# Patient Record
Sex: Male | Born: 1950 | Race: White | Hispanic: No | Marital: Married | Smoking: Former smoker
Health system: Southern US, Community
[De-identification: ages and names within clinical notes are randomized; demographics above are authoritative.]

## PROBLEM LIST (undated history)

## (undated) DIAGNOSIS — E785 Hyperlipidemia, unspecified: Secondary | ICD-10-CM

## (undated) HISTORY — DX: Hyperlipidemia, unspecified: E78.5

---
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
## 2003-07-10 ENCOUNTER — Ambulatory Visit (HOSPITAL_COMMUNITY): Admission: RE | Admit: 2003-07-10 | Discharge: 2003-07-10 | Payer: Self-pay | Admitting: Unknown Physician Specialty

## 2003-07-11 ENCOUNTER — Ambulatory Visit (HOSPITAL_COMMUNITY): Admission: RE | Admit: 2003-07-11 | Discharge: 2003-07-11 | Payer: Self-pay | Admitting: Unknown Physician Specialty

## 2003-07-14 ENCOUNTER — Ambulatory Visit (HOSPITAL_COMMUNITY): Admission: RE | Admit: 2003-07-14 | Discharge: 2003-07-14 | Payer: Self-pay | Admitting: Unknown Physician Specialty

## 2004-09-03 ENCOUNTER — Ambulatory Visit (HOSPITAL_COMMUNITY): Admission: RE | Admit: 2004-09-03 | Discharge: 2004-09-03 | Payer: Self-pay | Admitting: Gastroenterology

## 2004-09-03 ENCOUNTER — Encounter (INDEPENDENT_AMBULATORY_CARE_PROVIDER_SITE_OTHER): Payer: Self-pay | Admitting: *Deleted

## 2007-11-12 ENCOUNTER — Encounter: Payer: Self-pay | Admitting: Family Medicine

## 2010-02-12 LAB — HM COLONOSCOPY

## 2010-02-15 ENCOUNTER — Encounter: Payer: Self-pay | Admitting: Family Medicine

## 2010-02-25 ENCOUNTER — Encounter: Payer: Self-pay | Admitting: Family Medicine

## 2010-02-26 ENCOUNTER — Ambulatory Visit: Payer: Self-pay | Admitting: Family Medicine

## 2010-02-26 DIAGNOSIS — E785 Hyperlipidemia, unspecified: Secondary | ICD-10-CM | POA: Insufficient documentation

## 2010-02-26 HISTORY — DX: Hyperlipidemia, unspecified: E78.5

## 2010-05-25 NOTE — Procedures (Signed)
Summary: Colonoscopy Report/[facility]  Colonoscopy Report/[facility]   Imported By: Maryln Gottron 03/09/2010 14:23:00  _____________________________________________________________________  External Attachment:    Type:   Image     Comment:   External Document

## 2010-05-25 NOTE — Letter (Signed)
Summary: Records from [facility] [place] 2006 - 2009  Records from [facility] [place] 2006 - 2009   Imported By: Maryln Gottron 03/08/2010 08:37:03  _____________________________________________________________________  External Attachment:    Type:   Image     Comment:   External Document

## 2010-05-25 NOTE — Assessment & Plan Note (Signed)
Summary: NEW PT TRANSFER FROM [facility]//SLM/pt req cpx/cjr   Vital Signs:  Patient profile:   60 year old male Height:      74.50 inches Weight:      226 pounds BMI:     28.73 Temp:     99.3 degrees F oral Pulse rate:   88 / minute Pulse rhythm:   regular Resp:     12 per minute BP sitting:   140 / 82  (left arm) Cuff size:   large  Vitals Entered By: Sid Falcon LPN (February 26, 2010 2:57 PM)  Nutrition Counseling: Patient's BMI is greater than 25 and therefore counseled on weight management options.   History of Present Illness: Patient here for complete physical examination and to establish care. History of hyperlipidemia and migraine headaches. Colon polyps which were adenomatous. Recent colonoscopy just a couple weeks ago.  Couple of polyps removed with path pending.  PMH, FH, AND SH reviewed.  Preventive Screening-Counseling & Management  Alcohol-Tobacco     Smoking Status: current     Packs/Day: 0.5     Year Started: 2006  Caffeine-Diet-Exercise     Does Patient Exercise: no  Allergies (verified): No Known Drug Allergies  Past History:  Family History: Last updated: 02/26/2010 Family History of Alcoholism/Addiction, Father  heart disease, hypertension  Mother  Social History: Last updated: 02/26/2010 Occupation:  MD Married Current Smoker Alcohol use-yes Regular exercise-no  Risk Factors: Exercise: no (02/26/2010)  Risk Factors: Smoking Status: current (02/26/2010) Packs/Day: 0.5 (02/26/2010)  Past Medical History: Migraines Colon Polyps Hyperlipidemia  Past Surgical History: Ganglion cyst Left [phone] PMH-FH-SH reviewed for relevance  Family History: Family History of Alcoholism/Addiction, Father  heart disease, hypertension  Mother  Social History: Occupation:  MD Married Current Smoker Alcohol use-yes Regular exercise-no Packs/Day:  0.5 Smoking Status:  current Occupation:  employed Does Patient Exercise:   no  Review of Systems  The patient denies anorexia, fever, weight gain, vision loss, decreased hearing, hoarseness, chest pain, syncope, dyspnea on exertion, peripheral edema, prolonged cough, headaches, hemoptysis, abdominal pain, melena, hematochezia, severe indigestion/heartburn, hematuria, incontinence, genital sores, muscle weakness, suspicious skin lesions, transient blindness, difficulty walking, depression, unusual weight change, abnormal bleeding, enlarged lymph nodes, and testicular masses.         some recent weight loss by his efforts.  Physical Exam  General:  Well-developed,well-nourished,in no acute distress; alert,appropriate and cooperative throughout examination Head:  Normocephalic and atraumatic without obvious abnormalities. No apparent alopecia or balding. Eyes:  No corneal or conjunctival inflammation noted. EOMI. Perrla. Funduscopic exam benign, without hemorrhages, exudates or papilledema. Vision grossly normal. Ears:  External ear exam shows no significant lesions or deformities.  Otoscopic examination reveals clear canals, tympanic membranes are intact bilaterally without bulging, retraction, inflammation or discharge. Hearing is grossly normal bilaterally. Mouth:  Oral mucosa and oropharynx without lesions or exudates.  Teeth in good repair. Neck:  No deformities, masses, or tenderness noted. Lungs:  Normal respiratory effort, chest expands symmetrically. Lungs are clear to auscultation, no crackles or wheezes. Heart:  Normal rate and regular rhythm. S1 and S2 normal without gallop, murmur, click, rub or other extra sounds. Abdomen:  Bowel sounds positive,abdomen soft and non-tender without masses, organomegaly or hernias noted. Rectal:  colonoscopoy 2 weeks ago. Prostate:  per GI 2 weeks ago. Msk:  No deformity or scoliosis noted of thoracic or lumbar spine.   Extremities:  No clubbing, cyanosis, edema, or deformity noted with normal full range of motion of all  joints.  Neurologic:  alert & oriented X3 and cranial nerves II-XII intact.   Skin:  no rashes and no suspicious lesions.   Cervical Nodes:  No lymphadenopathy noted Psych:  Cognition and judgment appear intact. Alert and cooperative with normal attention span and concentration. No apparent delusions, illusions, hallucinations   Impression & Recommendations:  Problem # 1:  Preventive Health Care (ICD-V70.0) discussed smoking cessation.  Cont with weight loss efforts.  Labs reviewed with pt.  BP sl high today and pt will monitor.  Already had flu vaccine.  Complete Medication List: 1)  Crestor 10 Mg Tabs (Rosuvastatin calcium) .... One tab by mouth m-w-f   Orders Added: 1)  New Patient 40-64 years [99386]    Preventive Care Screening  Colonoscopy:    Date:  01/23/2010    Results:  normal   Last Tetanus Booster:    Date:  04/26/2003    Results:  Historical     Preventive Care Screening  Colonoscopy:    Date:  01/23/2010    Results:  normal   Last Tetanus Booster:    Date:  04/26/2003    Results:  Historical

## 2010-09-10 NOTE — Op Note (Signed)
NAMECALYN, Luis Ross              ACCOUNT NO.:  [id number]   MEDICAL RECORD NO.:  [id number]          PATIENT TYPE:  AMB   LOCATION:  ENDO                         FACILITY:  [facility]   PHYSICIAN:  Bernette Redbird, M.D.   DATE OF BIRTH:  07-24-1950   DATE OF PROCEDURE:  09/03/2004  DATE OF DISCHARGE:                                 OPERATIVE REPORT   PROCEDURE:  Colonoscopy with biopsy.   ENDOSCOPIST:  Bernette Redbird, M.D.   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   FINDINGS:  Diminutive polyp at 30 cm, otherwise normal to the terminal  ileum.   PROCEDURE:  The nature, purpose and risks of the procedure had been reviewed  with the patient, who provided written consent.  Sedation was fentanyl 25  mcg and Versed 2.5 mg IV, without arrhythmias or desaturation..  Digital  exam of the prostate was entirely normal.  The Olympus standard adult video  colonoscope was advanced to the terminal ileum without any difficulty and  pullback was then performed.  The quality of prep was excellent and it was  felt that all areas were well-seen.   There was a 2- to 3-mm sessile polyp removed by cold biopsy during insertion  of the scope.  On pullout, it was measured at about 30 cm from the external  anal opening.  No other polyps were seen and there was evidence of cancer,  colitis, vascular malformations or diverticulosis.  Retroflexion in the  rectum and re-inspection of the rectum were normal.   The patient tolerated the procedure well and there were no apparent  complications.   IMPRESSION:  Solitary diminutive polyp in the sigmoid colon (211.3).   PLAN:  Await pathology results.  Anticipate colonoscopic followup in 5  years, in view of the family history of colon polyp.      RB/MEDQ  D:  09/03/2004  T:  09/04/2004  Job:  [id number]

## 2010-09-10 NOTE — Procedures (Signed)
NAMEZAYLYN, Luis Ross                        ACCOUNT NO.:  [id number]   MEDICAL RECORD NO.:  [id number]                   PATIENT TYPE:  OUT   LOCATION:  RAD                                  FACILITY:  [facility]   PHYSICIAN:  Vida Roller, M.D.                DATE OF BIRTH:  [birth year]   DATE OF PROCEDURE:  07/14/2003  DATE OF DISCHARGE:                                  ECHOCARDIOGRAM   TAPE NUMBER:  [id number], tape count [id number].   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  No previous studies.   TECHNICAL QUALITY:  Poor.   M-MODE TRACINGS:  1. The aorta is 32 mm.  2. Left atrium is 43 mm.  3. The septum is 15 mm.  4. The posterior wall is 15 mm.  5. The left ventricular diastolic dimension is 46 mm.  6. The left ventricular systolic dimension is 33 mm.   2-D AND DOPPLER IMAGING:  1. The left ventricle is normal size with mild concentric left ventricular     hypertrophy.  There are no obvious wall motion abnormalities.  Overall     ejection fraction is 55 to 60%.  Diastolic function appears to be mildly     impaired by transmitral Doppler and pulmonary venous tracings.  2. The right ventricle is normal size with normal systolic function.  3. Both atria appear to be top normal size with the left atrium being     borderline enlarged.  There is no obvious atrial septal defect.  4. The aortic valve appears to open normally.  It is mildly sclerotic.     There is no stenosis of regurgitation seen.  5. The mitral valve is morphologically unremarkable with no evidence of     stenosis or regurgitation.  6. The tricuspid valve has mild regurgitation.  7. Pulmonic valve was not well seen.  8. The inferior vena cava appears to be normal size.  9. The pericardial structures are normal.  10.      The ascending aorta was not well seen.      ___________________________________________  Vida Roller, M.D.   JH/MEDQ  D:  07/14/2003  T:  07/15/2003  Job:  [id number]

## 2011-04-15 ENCOUNTER — Ambulatory Visit (INDEPENDENT_AMBULATORY_CARE_PROVIDER_SITE_OTHER): Payer: 59 | Admitting: Family Medicine

## 2011-04-15 ENCOUNTER — Encounter: Payer: Self-pay | Admitting: Family Medicine

## 2011-04-15 DIAGNOSIS — F17200 Nicotine dependence, unspecified, uncomplicated: Secondary | ICD-10-CM

## 2011-04-15 DIAGNOSIS — Z0000 Encounter for general adult medical examination without abnormal findings: Secondary | ICD-10-CM

## 2011-04-15 NOTE — Progress Notes (Signed)
  Subjective:    Patient ID: Luis Ross, male    DOB: [birth year], 60 y.o.   MRN: [id number]  HPI  Patient for complete physical. History of hyperlipidemia but this has been mild. He had myalgias with multiple statins and took himself off recently. He is still smoking about half pack cigarettes per day. Tried quitting several times in the past. Intolerance to Chantix and Wellbutrin. 2005. Flu vaccine already received.  Family history, social history, and past medical history reviewed as below  Past Medical History  Diagnosis Date  . HYPERLIPIDEMIA 02/26/2010   Past Surgical History  Procedure Date  . Ganglion cyst excision 1964    left wrist    reports that he has been smoking Cigarettes.  He has a 5 pack-year smoking history. He does not have any smokeless tobacco history on file. His alcohol and drug histories not on file. family history includes Alcohol abuse in his father; Heart disease in his mother; and Hypertension in his mother. Allergies  Allergen Reactions  . Crestor (Rosuvastatin Calcium)     myalgias      Review of Systems  Constitutional: Negative for fever, activity change, appetite change and fatigue.  HENT: Negative for ear pain, congestion and trouble swallowing.   Eyes: Negative for pain and visual disturbance.  Respiratory: Negative for cough, shortness of breath and wheezing.   Cardiovascular: Negative for chest pain and palpitations.  Gastrointestinal: Negative for nausea, vomiting, abdominal pain, diarrhea, constipation, blood in stool, abdominal distention and rectal pain.  Genitourinary: Negative for dysuria, hematuria and testicular pain.  Musculoskeletal: Negative for joint swelling and arthralgias.  Skin: Negative for rash.  Neurological: Negative for dizziness, syncope and headaches.  Hematological: Negative for adenopathy.  Psychiatric/Behavioral: Negative for confusion and dysphoric mood.       Objective:   Physical Exam  Constitutional:  He is oriented to person, place, and time. He appears well-developed and well-nourished. No distress.  HENT:  Head: Normocephalic and atraumatic.  Right Ear: External ear normal.  Left Ear: External ear normal.  Mouth/Throat: Oropharynx is clear and moist.  Eyes: Conjunctivae and EOM are normal. Pupils are equal, round, and reactive to light.  Neck: Normal range of motion. Neck supple. No thyromegaly present.  Cardiovascular: Normal rate, regular rhythm and normal heart sounds.   No murmur heard. Pulmonary/Chest: No respiratory distress. He has no wheezes. He has no rales.  Abdominal: Soft. Bowel sounds are normal. He exhibits no distension and no mass. There is no tenderness. There is no rebound and no guarding.  Genitourinary: Rectum normal and prostate normal.  Musculoskeletal: He exhibits no edema.  Lymphadenopathy:    He has no cervical adenopathy.  Neurological: He is alert and oriented to person, place, and time. He displays normal reflexes. No cranial nerve deficit.  Skin: No rash noted.  Psychiatric: He has a normal mood and affect.          Assessment & Plan:  Complete physical. Patient strongly encouraged to stop smoking. 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 Labs reviewed. Prediabetes with fasting blood sugar 100. Continue exercise weight loss efforts. Colonoscopy up-to-date.

## 2011-04-16 DIAGNOSIS — F17200 Nicotine dependence, unspecified, uncomplicated: Secondary | ICD-10-CM | POA: Insufficient documentation

## 2013-07-05 ENCOUNTER — Encounter: Payer: Self-pay | Admitting: Family Medicine

## 2013-07-05 ENCOUNTER — Ambulatory Visit (INDEPENDENT_AMBULATORY_CARE_PROVIDER_SITE_OTHER): Payer: 59 | Admitting: Family Medicine

## 2013-07-05 VITALS — BP 140/80 | HR 98 | Temp 98.6°F | Ht 74.5 in | Wt 226.0 lb

## 2013-07-05 DIAGNOSIS — E119 Type 2 diabetes mellitus without complications: Secondary | ICD-10-CM

## 2013-07-05 DIAGNOSIS — Z0000 Encounter for general adult medical examination without abnormal findings: Secondary | ICD-10-CM

## 2013-07-05 NOTE — Progress Notes (Signed)
Pre visit review using our clinic review tool, if applicable. No additional management support is needed unless otherwise documented below in the visit note. 

## 2013-07-05 NOTE — Progress Notes (Signed)
Subjective:    Patient ID: Luis Ross, male    DOB: June 18, 1950, 63 y.o.   MRN: [id number]  HPI Patient seen for complete physical. He has history of dyslipidemia has been intolerant of higher dose statins and only takes very low dose Crestor- 5mg  once weekly. 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 These were significant for glucose of 194. He's had no history of diabetes and no recent symptoms of polyuria or polydipsia. Hemoglobin A1c 7.3%. He does not have any family history of type 2 diabetes. He quit smoking a year and half ago and has gained about 20 pounds since then. No consistent exercise.  Colonoscopy 2011. Repeat due next year. Last tetanus 3 months ago. No shingles vaccine yet.  Past Medical History  Diagnosis Date  . HYPERLIPIDEMIA 02/26/2010   Past Surgical History  Procedure Laterality Date  . Ganglion cyst excision  1964    left wrist    reports that he has been smoking Cigarettes.  He has a 5 pack-year smoking history. He does not have any smokeless tobacco history on file. His alcohol and drug histories are not on file. family history includes Alcohol abuse in his father; Heart disease in his mother; Hypertension in his mother. Allergies  Allergen Reactions  . Crestor [Rosuvastatin Calcium]     myalgias      Review of Systems  Constitutional: Positive for fatigue. Negative for fever, activity change and appetite change.  HENT: Negative for congestion, ear pain and trouble swallowing.   Eyes: Negative for pain and visual disturbance.  Respiratory: Negative for cough, shortness of breath and wheezing.   Cardiovascular: Negative for chest pain and palpitations.  Gastrointestinal: Negative for nausea, vomiting, abdominal pain, diarrhea, constipation, blood in stool, abdominal distention and rectal pain.  Endocrine: Negative for polydipsia and polyuria.  Genitourinary: Negative for dysuria, hematuria and testicular pain.  Musculoskeletal:  Negative for arthralgias and joint swelling.  Skin: Negative for rash.  Neurological: Negative for dizziness, syncope and headaches.  Hematological: Negative for adenopathy.  Psychiatric/Behavioral: Negative for confusion and dysphoric mood.       Objective:   Physical Exam  Constitutional: He is oriented to person, place, and time. He appears well-developed and well-nourished. No distress.  HENT:  Head: Normocephalic and atraumatic.  Right Ear: External ear normal.  Left Ear: External ear normal.  Mouth/Throat: Oropharynx is clear and moist.  Eyes: Conjunctivae and EOM are normal. Pupils are equal, round, and reactive to light.  Neck: Normal range of motion. Neck supple. No thyromegaly present.  Cardiovascular: Normal rate, regular rhythm and normal heart sounds.   No murmur heard. Pulmonary/Chest: No respiratory distress. He has no wheezes. He has no rales.  Abdominal: Soft. Bowel sounds are normal. He exhibits no distension and no mass. There is no tenderness. There is no rebound and no guarding.  Genitourinary: Rectum normal and prostate normal.  Musculoskeletal: He exhibits no edema.  Lymphadenopathy:    He has no cervical adenopathy.  Neurological: He is alert and oriented to person, place, and time. He displays normal reflexes. No cranial nerve deficit.  Skin: No rash noted.  Psychiatric: He has a normal mood and affect.          Assessment & Plan:  Complete physical. Labs reviewed. Recent fasting blood sugar 194 with A1c 7.3%. He'll continue close monitoring. Start metformin.  He will start Glumetza 500 mg once daily.  Work on weight loss. Reassess A1c 3 months. Patient  will get shingles vaccine through his work. Tetanus up-to-date. Repeat colonoscopy next year.

## 2013-07-06 DIAGNOSIS — E119 Type 2 diabetes mellitus without complications: Secondary | ICD-10-CM | POA: Insufficient documentation

## 2013-07-19 ENCOUNTER — Encounter: Payer: 59 | Admitting: Family Medicine

## 2013-10-11 ENCOUNTER — Encounter: Payer: Self-pay | Admitting: Family Medicine

## 2013-10-11 ENCOUNTER — Ambulatory Visit (INDEPENDENT_AMBULATORY_CARE_PROVIDER_SITE_OTHER): Payer: Managed Care, Other (non HMO) | Admitting: Family Medicine

## 2013-10-11 VITALS — BP 130/60 | HR 85 | Temp 98.6°F | Wt 206.0 lb

## 2013-10-11 DIAGNOSIS — E119 Type 2 diabetes mellitus without complications: Secondary | ICD-10-CM

## 2013-10-11 MED ORDER — LISINOPRIL 5 MG PO TABS: 5.0000 mg | ORAL_TABLET | Freq: Every day | ORAL | Status: DC

## 2013-10-11 NOTE — Progress Notes (Signed)
   Subjective:    Patient ID: Luis Ross, male    DOB: [birth year], 63 y.o.   MRN: [id number]  HPI Followup type 2 diabetes. He was started on metformin and even at low dosage of 500 mg once daily and extended metformin he had diarrhea symptoms. Metformin was stopped and he started Invokana  Initially 100 mg a day and then subsequently 300 mg daily. He is tolerated well with exception of mild urine frequency. He has lost 20 pounds. He is making dietary changes. He's walking some for exercise. Recent A1c 6.2% which is improved from 7.3%. Urine microalbumin screen normal. Overall he feels much better. More energy.  Hyperlipidemia treated with low-dose Crestor. He had myalgias with many other statins. He is currently on taking 5 mg of Crestor once weekly and unable to tolerate more frequent dosing.  Past Medical History  Diagnosis Date  . HYPERLIPIDEMIA 02/26/2010   Past Surgical History  Procedure Laterality Date  . Ganglion cyst excision  1964    left wrist    reports that he has been smoking Cigarettes.  He has a 5 pack-year smoking history. He does not have any smokeless tobacco history on file. His alcohol and drug histories are not on file. family history includes Alcohol abuse in his father; Heart disease in his mother; Hypertension in his mother. Allergies  Allergen Reactions  . Crestor [Rosuvastatin Calcium]     myalgias      Review of Systems  Constitutional: Negative for fatigue and unexpected weight change.  Eyes: Negative for visual disturbance.  Respiratory: Negative for cough, chest tightness and shortness of breath.   Cardiovascular: Negative for chest pain, palpitations and leg swelling.  Neurological: Negative for dizziness, syncope, weakness, light-headedness and headaches.          Physical Exam  Constitutional: He appears well-developed and well-nourished.  Cardiovascular: Normal rate and regular rhythm.   Pulmonary/Chest: Effort normal and breath sounds  normal. No respiratory distress. He has no wheezes. He has no rales.  Skin:  Feet reveal no skin lesions. Good distal foot pulses. Good capillary refill. No calluses. Normal sensation with monofilament testing           Assessment & Plan:  #1 type 2 diabetes. Significantly improved with weight loss efforts. Continue Invokana.  Consider repeat A1c in 6 months. We discussed starting low-dose ACE inhibitor with lisinopril 5 mg daily #2 dyslipidemia. He'll try Crestor 5 mg one half tablet twice weekly. He is been unable to tolerate more frequent dosing

## 2013-10-11 NOTE — Progress Notes (Signed)
Pre-visit discussion using our clinic review tool. No additional management support is needed unless otherwise documented below in the visit note.  

## 2013-10-12 ENCOUNTER — Telehealth: Payer: Self-pay | Admitting: Family Medicine

## 2013-10-12 NOTE — Telephone Encounter (Signed)
Relevant patient education mailed to patient.  

## 2013-11-04 ENCOUNTER — Other Ambulatory Visit: Payer: Self-pay | Admitting: *Deleted

## 2013-11-04 MED ORDER — LISINOPRIL 5 MG PO TABS: 5.0000 mg | ORAL_TABLET | Freq: Every day | ORAL | Status: DC

## 2013-11-04 NOTE — Telephone Encounter (Signed)
Patient asked to resend Rx for lisinopril because pharmacy never received it.  Rx sent.

## 2013-11-11 ENCOUNTER — Telehealth: Payer: Self-pay | Admitting: Family Medicine

## 2013-11-11 MED ORDER — LISINOPRIL 5 MG PO TABS: 5.0000 mg | ORAL_TABLET | Freq: Every day | ORAL | Status: DC

## 2013-11-11 NOTE — Telephone Encounter (Signed)
[facility] - [place], [place] - [phone] is requesting a 90 day re-fill on lisinopril (PRINIVIL,ZESTRIL) 5 MG tablet

## 2013-11-11 NOTE — Telephone Encounter (Signed)
Rx sent to mail order

## 2013-11-13 ENCOUNTER — Telehealth: Payer: Self-pay | Admitting: Family Medicine

## 2013-11-13 MED ORDER — LISINOPRIL 5 MG PO TABS: 5.0000 mg | ORAL_TABLET | Freq: Every day | ORAL | Status: DC

## 2013-11-13 NOTE — Telephone Encounter (Signed)
[facility] - [place], [place] - [phone] is requesting re-fill on lisinopril (PRINIVIL,ZESTRIL) 5 MG tablet

## 2013-11-13 NOTE — Telephone Encounter (Signed)
RX sent to mail order 

## 2014-01-24 ENCOUNTER — Ambulatory Visit (INDEPENDENT_AMBULATORY_CARE_PROVIDER_SITE_OTHER): Payer: Managed Care, Other (non HMO) | Admitting: Family Medicine

## 2014-01-24 ENCOUNTER — Encounter: Payer: Self-pay | Admitting: Family Medicine

## 2014-01-24 VITALS — BP 130/70 | HR 91 | Wt 196.0 lb

## 2014-01-24 DIAGNOSIS — E785 Hyperlipidemia, unspecified: Secondary | ICD-10-CM

## 2014-01-24 DIAGNOSIS — E119 Type 2 diabetes mellitus without complications: Secondary | ICD-10-CM

## 2014-01-24 MED ORDER — PRAVASTATIN SODIUM 20 MG PO TABS: 20.0000 mg | ORAL_TABLET | Freq: Every day | ORAL | Status: DC

## 2014-01-24 NOTE — Progress Notes (Signed)
Pre visit review using our clinic review tool, if applicable. No additional management support is needed unless otherwise documented below in the visit note. 

## 2014-01-24 NOTE — Patient Instructions (Signed)
Repeat lipid and hepatic in 6-8 weeks.

## 2014-01-24 NOTE — Progress Notes (Signed)
   Subjective:    Patient ID: Luis Ross, male    DOB: July 26, 1950, 63 y.o.   MRN: [id number]  Diabetes Pertinent negatives for hypoglycemia include no dizziness or headaches. Pertinent negatives for diabetes include no chest pain, no fatigue and no weakness.   Patient seen for medical followup. Type 2 diabetes diagnosed last year. Initial A1c 7.3%. He has done tremendous job with weight loss and recent A1c 6.0%. He had diarrhea with metformin and is currently taking Invokana 300 mg once daily-without side effects. He had myalgias with Crestor. He never started lisinopril. He is walking some for exercise. Overall feels much better.  Past Medical History  Diagnosis Date  . HYPERLIPIDEMIA 02/26/2010   Past Surgical History  Procedure Laterality Date  . Ganglion cyst excision  1964    left wrist    reports that he has been smoking Cigarettes.  He has a 5 pack-year smoking history. He does not have any smokeless tobacco history on file. His alcohol and drug histories are not on file. family history includes Alcohol abuse in his father; Heart disease in his mother; Hypertension in his mother. Allergies  Allergen Reactions  . Crestor [Rosuvastatin Calcium]     myalgias      Review of Systems  Constitutional: Negative for fatigue.  Eyes: Negative for visual disturbance.  Respiratory: Negative for cough, chest tightness and shortness of breath.   Cardiovascular: Negative for chest pain, palpitations and leg swelling.  Neurological: Negative for dizziness, syncope, weakness, light-headedness and headaches.       Objective:   Physical Exam  Constitutional: He is oriented to person, place, and time. He appears well-developed and well-nourished.  HENT:  Right Ear: External ear normal.  Left Ear: External ear normal.  Mouth/Throat: Oropharynx is clear and moist.  Eyes: Pupils are equal, round, and reactive to light.  Neck: Neck supple. No thyromegaly present.  Cardiovascular:  Normal rate and regular rhythm.   Pulmonary/Chest: Effort normal and breath sounds normal. No respiratory distress. He has no wheezes. He has no rales.  Musculoskeletal: He exhibits no edema.  Neurological: He is alert and oriented to person, place, and time.          Assessment & Plan:  Type 2 diabetes. Improved. Continue weight control efforts. Recheck A1c in 3 months.  Continue with Invokana.  Hyperlipidemia. Crestor associated myalgias. Try pravastatin 20 mg once daily. Lipid panel in 6-8 weeks.

## 2014-05-02 ENCOUNTER — Ambulatory Visit: Payer: Managed Care, Other (non HMO) | Admitting: Family Medicine

## 2014-05-23 ENCOUNTER — Ambulatory Visit (INDEPENDENT_AMBULATORY_CARE_PROVIDER_SITE_OTHER): Payer: Managed Care, Other (non HMO) | Admitting: Family Medicine

## 2014-05-23 ENCOUNTER — Encounter: Payer: Self-pay | Admitting: Family Medicine

## 2014-05-23 VITALS — BP 130/78 | HR 93 | Temp 98.6°F | Wt 194.0 lb

## 2014-05-23 DIAGNOSIS — E119 Type 2 diabetes mellitus without complications: Secondary | ICD-10-CM

## 2014-05-23 NOTE — Progress Notes (Signed)
   Subjective:    Patient ID: Luis Ross, male    DOB: [birth year], 64 y.o.   MRN: [id number]  HPI   Follow-up type 2 diabetes. He has done excellent job with weight loss and is walking couple miles most days. He had some intolerance with metformin and is currently on Invokana. Also taking pravastatin 3 times per week and low-dose lisinopril 5 mg once daily. No hypertension issues.  Generally feels well. No symptoms of polyuria or polydipsia. He brings in copy of labs today. A1c 6.1%. Lipids are well controlled. No major abnormalities  Past Medical History  Diagnosis Date  . HYPERLIPIDEMIA 02/26/2010   Past Surgical History  Procedure Laterality Date  . Ganglion cyst excision  1964    left wrist    reports that he has been smoking Cigarettes.  He has a 5 pack-year smoking history. He does not have any smokeless tobacco history on file. His alcohol and drug histories are not on file. family history includes Alcohol abuse in his father; Heart disease in his mother; Hypertension in his mother. Allergies  Allergen Reactions  . Crestor [Rosuvastatin Calcium]     myalgias      Review of Systems  Constitutional: Negative for fever, activity change, appetite change and fatigue.  HENT: Negative for congestion, ear pain and trouble swallowing.   Eyes: Negative for pain and visual disturbance.  Respiratory: Negative for cough, shortness of breath and wheezing.   Cardiovascular: Negative for chest pain and palpitations.  Gastrointestinal: Negative for nausea, vomiting, abdominal pain, diarrhea, constipation, blood in stool, abdominal distention and rectal pain.  Endocrine: Negative for polydipsia and polyuria.  Genitourinary: Negative for dysuria, hematuria and testicular pain.  Musculoskeletal: Negative for joint swelling and arthralgias.  Skin: Negative for rash.  Neurological: Negative for dizziness, syncope and headaches.  Hematological: Negative for adenopathy.    Psychiatric/Behavioral: Negative for confusion and dysphoric mood.       Objective:   Physical Exam  Constitutional: He appears well-developed and well-nourished.  Cardiovascular: Normal rate and regular rhythm.   No murmur heard. Pulmonary/Chest: Effort normal and breath sounds normal. No respiratory distress. He has no wheezes. He has no rales.  Musculoskeletal: He exhibits no edema.          Assessment & Plan:  Type 2 diabetes. Improved. Continue Invokana. Continue weight control efforts. Reassess hemoglobin A1c in 3 months

## 2014-05-23 NOTE — Progress Notes (Signed)
Pre visit review using our clinic review tool, if applicable. No additional management support is needed unless otherwise documented below in the visit note. 

## 2014-05-26 ENCOUNTER — Ambulatory Visit: Payer: Managed Care, Other (non HMO) | Admitting: Family Medicine

## 2014-08-29 ENCOUNTER — Encounter: Payer: Self-pay | Admitting: Family Medicine

## 2014-08-29 ENCOUNTER — Ambulatory Visit (INDEPENDENT_AMBULATORY_CARE_PROVIDER_SITE_OTHER): Payer: Managed Care, Other (non HMO) | Admitting: Family Medicine

## 2014-08-29 VITALS — BP 130/74 | HR 88 | Temp 98.4°F | Wt 189.0 lb

## 2014-08-29 DIAGNOSIS — E119 Type 2 diabetes mellitus without complications: Secondary | ICD-10-CM | POA: Diagnosis not present

## 2014-08-29 MED ORDER — CANAGLIFLOZIN 300 MG PO TABS: 300.0000 mg | ORAL_TABLET | Freq: Every day | ORAL | Status: DC

## 2014-08-29 NOTE — Progress Notes (Signed)
Pre visit review using our clinic review tool, if applicable. No additional management support is needed unless otherwise documented below in the visit note. 

## 2014-08-29 NOTE — Progress Notes (Signed)
   Subjective:    Patient ID: Luis Ross, male    DOB: 03/29/1951, 64 y.o.   MRN: [id number]  HPI Follow-up type 2 diabetes. Patient on Invokana 300 mg once daily. He has done excellent job with maintaining his weight loss. Repeat A1c 6.2%. He is somewhat disappointed this is not lower. He is still taking low-dose lisinopril and has occasional lightheadedness. Could not tolerate even low-dose pravastatin secondary to myalgias and cramps.  Past Medical History  Diagnosis Date  . HYPERLIPIDEMIA 02/26/2010   Past Surgical History  Procedure Laterality Date  . Ganglion cyst excision  1964    left wrist    reports that he has been smoking Cigarettes.  He has a 5 pack-year smoking history. He does not have any smokeless tobacco history on file. His alcohol and drug histories are not on file. family history includes Alcohol abuse in his father; Heart disease in his mother; Hypertension in his mother. Allergies  Allergen Reactions  . Crestor [Rosuvastatin Calcium]     myalgias      Review of Systems  Constitutional: Negative for fatigue and unexpected weight change.  Eyes: Negative for visual disturbance.  Respiratory: Negative for cough, chest tightness and shortness of breath.   Cardiovascular: Negative for chest pain, palpitations and leg swelling.  Neurological: Negative for dizziness, syncope, weakness, light-headedness and headaches.       Objective:   Physical Exam  Constitutional: He appears well-developed and well-nourished. No distress.  Cardiovascular: Normal rate and regular rhythm.   Pulmonary/Chest: Effort normal and breath sounds normal. No respiratory distress. He has no wheezes. He has no rales.          Assessment & Plan:  Type 2 diabetes. Stable and at goal. Continue weight control efforts. Reassess A1c in 3 months

## 2014-08-30 DIAGNOSIS — E119 Type 2 diabetes mellitus without complications: Secondary | ICD-10-CM | POA: Insufficient documentation

## 2014-08-30 DIAGNOSIS — E1165 Type 2 diabetes mellitus with hyperglycemia: Secondary | ICD-10-CM | POA: Insufficient documentation

## 2014-09-03 ENCOUNTER — Other Ambulatory Visit: Payer: Self-pay | Admitting: *Deleted

## 2014-09-03 MED ORDER — CANAGLIFLOZIN 300 MG PO TABS: 300.0000 mg | ORAL_TABLET | Freq: Every day | ORAL | Status: DC

## 2014-09-09 ENCOUNTER — Encounter: Payer: Self-pay | Admitting: Family Medicine

## 2014-11-14 LAB — HM DIABETES EYE EXAM

## 2014-11-19 ENCOUNTER — Encounter: Payer: Self-pay | Admitting: Family Medicine

## 2014-12-05 ENCOUNTER — Encounter: Payer: Self-pay | Admitting: Family Medicine

## 2014-12-05 ENCOUNTER — Ambulatory Visit (INDEPENDENT_AMBULATORY_CARE_PROVIDER_SITE_OTHER): Payer: Managed Care, Other (non HMO) | Admitting: Family Medicine

## 2014-12-05 VITALS — BP 128/70 | Temp 98.3°F | Wt 189.0 lb

## 2014-12-05 DIAGNOSIS — E119 Type 2 diabetes mellitus without complications: Secondary | ICD-10-CM | POA: Diagnosis not present

## 2014-12-05 DIAGNOSIS — E785 Hyperlipidemia, unspecified: Secondary | ICD-10-CM | POA: Diagnosis not present

## 2014-12-05 MED ORDER — EZETIMIBE 10 MG PO TABS: 10.0000 mg | ORAL_TABLET | Freq: Every day | ORAL | Status: DC

## 2014-12-05 MED ORDER — LISINOPRIL 5 MG PO TABS: 5.0000 mg | ORAL_TABLET | Freq: Every day | ORAL | Status: DC

## 2014-12-05 NOTE — Progress Notes (Signed)
   Subjective:    Patient ID: Luis Ross, male    DOB: October 30, 1950, 64 y.o.   MRN: [id number]  HPI Routine follow-up. Type 2 diabetes. Excellent control. Recent labs A1c 5.8%. Remains on Invokana,. He has been maintaining his weight. Inconsistent exercise. He's had difficulty with many statins in the past. Recent LDL cholesterol 107 with total cholesterol 178. HDL 57. He has not tried Zetia previously. Denies any recent chest pains.  Past Medical History  Diagnosis Date  . HYPERLIPIDEMIA 02/26/2010   Past Surgical History  Procedure Laterality Date  . Ganglion cyst excision  1964    left wrist    reports that he has been smoking Cigarettes.  He has a 5 pack-year smoking history. He does not have any smokeless tobacco history on file. His alcohol and drug histories are not on file. family history includes Alcohol abuse in his father; Heart disease in his mother; Hypertension in his mother. Allergies  Allergen Reactions  . Crestor [Rosuvastatin Calcium]     myalgias      Review of Systems  Constitutional: Negative for fatigue.  Eyes: Negative for visual disturbance.  Respiratory: Negative for cough, chest tightness and shortness of breath.   Cardiovascular: Negative for chest pain, palpitations and leg swelling.  Neurological: Negative for dizziness, syncope, weakness, light-headedness and headaches.       Objective:   Physical Exam  Constitutional: He is oriented to person, place, and time. He appears well-developed and well-nourished.  HENT:  Right Ear: External ear normal.  Left Ear: External ear normal.  Mouth/Throat: Oropharynx is clear and moist.  Eyes: Pupils are equal, round, and reactive to light.  Neck: Neck supple. No thyromegaly present.  Cardiovascular: Normal rate and regular rhythm.   Pulmonary/Chest: Effort normal and breath sounds normal. No respiratory distress. He has no wheezes. He has no rales.  Musculoskeletal: He exhibits no edema.  Neurological:  He is alert and oriented to person, place, and time.          Assessment & Plan:  #1 type 2 diabetes. Excellent control. Recent A1c 5.8%. Continue Invokana, #2 dyslipidemia. Recent HDL increased. LDL above 100. Intolerance to many statins.  Start Zetia 10 mg once daily. He will get follow-up lipids in 6-8 weeks

## 2014-12-05 NOTE — Progress Notes (Signed)
Pre visit review using our clinic review tool, if applicable. No additional management support is needed unless otherwise documented below in the visit note. 

## 2015-03-13 ENCOUNTER — Ambulatory Visit (INDEPENDENT_AMBULATORY_CARE_PROVIDER_SITE_OTHER): Payer: Managed Care, Other (non HMO) | Admitting: Family Medicine

## 2015-03-13 ENCOUNTER — Encounter: Payer: Self-pay | Admitting: Family Medicine

## 2015-03-13 VITALS — BP 120/80 | HR 95 | Temp 98.7°F | Resp 16 | Ht 74.5 in | Wt 197.0 lb

## 2015-03-13 DIAGNOSIS — E785 Hyperlipidemia, unspecified: Secondary | ICD-10-CM | POA: Diagnosis not present

## 2015-03-13 DIAGNOSIS — E119 Type 2 diabetes mellitus without complications: Secondary | ICD-10-CM

## 2015-03-13 NOTE — Progress Notes (Signed)
Pre visit review using our clinic review tool, if applicable. No additional management support is needed unless otherwise documented below in the visit note. 

## 2015-03-13 NOTE — Progress Notes (Signed)
   Subjective:    Patient ID: Luis Ross, male    DOB: April 11, 1951, 64 y.o.   MRN: [id number]  HPI Medical follow-up.   Has gained about 8 pounds since last visit. Blood sugars have remained stable. He had lab work yesterday. Hemoglobin A1c 6.0%. Cholesterol 179 with LDL 113. He had some myalgias with Zetia. He stopped this several weeks ago and would like to consider another trial with this. He has had intolerance with multiple statins in the past including very low-dose statins. His mother had coronary disease in her early 6360s.  Past Medical History  Diagnosis Date  . HYPERLIPIDEMIA 02/26/2010   Past Surgical History  Procedure Laterality Date  . Ganglion cyst excision  1964    left wrist    reports that he has been smoking Cigarettes.  He has a 5 pack-year smoking history. He does not have any smokeless tobacco history on file. His alcohol and drug histories are not on file. family history includes Alcohol abuse in his father; Heart disease in his mother; Hypertension in his mother. Allergies  Allergen Reactions  . Crestor [Rosuvastatin Calcium]     myalgias      Review of Systems  Constitutional: Negative for fatigue.  Eyes: Negative for visual disturbance.  Respiratory: Negative for cough, chest tightness and shortness of breath.   Cardiovascular: Negative for chest pain, palpitations and leg swelling.  Endocrine: Negative for polydipsia and polyuria.  Neurological: Negative for dizziness, syncope, weakness, light-headedness and headaches.       Objective:   Physical Exam  Constitutional: He is oriented to person, place, and time. He appears well-developed and well-nourished.  HENT:  Right Ear: External ear normal.  Left Ear: External ear normal.  Mouth/Throat: Oropharynx is clear and moist.  Eyes: Pupils are equal, round, and reactive to light.  Neck: Neck supple. No thyromegaly present.  Cardiovascular: Normal rate and regular rhythm.   Pulmonary/Chest: Effort  normal and breath sounds normal. No respiratory distress. He has no wheezes. He has no rales.  Musculoskeletal: He exhibits no edema.  Neurological: He is alert and oriented to person, place, and time.          Assessment & Plan:  #1Type 2 diabetes. Good control. Continue current regimen. Recheck A1c in 3 months #2 dyslipidemia. LDL slightly above goal.We recommended he try Zetia once more.

## 2015-06-19 ENCOUNTER — Encounter: Payer: Self-pay | Admitting: Family Medicine

## 2015-06-19 ENCOUNTER — Ambulatory Visit (INDEPENDENT_AMBULATORY_CARE_PROVIDER_SITE_OTHER): Payer: Managed Care, Other (non HMO) | Admitting: Family Medicine

## 2015-06-19 VITALS — BP 104/80 | HR 113 | Temp 99.1°F | Ht 74.5 in | Wt 200.4 lb

## 2015-06-19 DIAGNOSIS — E119 Type 2 diabetes mellitus without complications: Secondary | ICD-10-CM | POA: Diagnosis not present

## 2015-06-19 NOTE — Progress Notes (Signed)
Pre visit review using our clinic review tool, if applicable. No additional management support is needed unless otherwise documented below in the visit note. 

## 2015-06-19 NOTE — Progress Notes (Signed)
   Subjective:    Patient ID: Luis Ross, male    DOB: 1950-06-29, 65 y.o.   MRN: [id number]  HPI Follow-up type 2 diabetes. He has gained a couple of pounds since last visit. Not exercising consistently. Blood sugars been stable. A1c 6.1%. No symptoms of polyuria or polydipsia.  Mild dyslipidemia. Started back Zetia since last visit and has not seen any recurrence of myalgias. He has occasional foot cramps. He's not any recent chest pains. Remains on low-dose lisinopril for kidney protection. No history of hypertension. Is currently taking Invokana for his diabetes  Past Medical History  Diagnosis Date  . HYPERLIPIDEMIA 02/26/2010   Past Surgical History  Procedure Laterality Date  . Ganglion cyst excision  1964    left wrist    reports that he quit smoking about 3 years ago. His smoking use included Cigarettes. He has a 5 pack-year smoking history. He does not have any smokeless tobacco history on file. His alcohol and drug histories are not on file. family history includes Alcohol abuse in his father; Heart disease in his mother; Hypertension in his mother. Allergies  Allergen Reactions  . Crestor [Rosuvastatin Calcium]     myalgias      Review of Systems  Constitutional: Negative for fatigue.  Eyes: Negative for visual disturbance.  Respiratory: Negative for cough, chest tightness and shortness of breath.   Cardiovascular: Negative for chest pain, palpitations and leg swelling.  Endocrine: Negative for polydipsia and polyuria.  Neurological: Negative for dizziness, syncope, weakness, light-headedness and headaches.       Objective:   Physical Exam  Constitutional: He is oriented to person, place, and time. He appears well-developed and well-nourished.  HENT:  Right Ear: External ear normal.  Left Ear: External ear normal.  Mouth/Throat: Oropharynx is clear and moist.  Eyes: Pupils are equal, round, and reactive to light.  Neck: Neck supple. No thyromegaly  present.  Cardiovascular: Normal rate and regular rhythm.   Pulmonary/Chest: Effort normal and breath sounds normal. No respiratory distress. He has no wheezes. He has no rales.  Musculoskeletal: He exhibits no edema.  Neurological: He is alert and oriented to person, place, and time.          Assessment & Plan:  Type 2 diabetes. Stable with recent A1c 6.1%. Continue current medications as above. Lipids are improved with LDL 74. Continue with Zetia.  Follow-up in 3 months to reassess

## 2015-08-21 LAB — HM COLONOSCOPY

## 2015-09-08 ENCOUNTER — Encounter: Payer: Self-pay | Admitting: Family Medicine

## 2015-10-09 ENCOUNTER — Ambulatory Visit (INDEPENDENT_AMBULATORY_CARE_PROVIDER_SITE_OTHER): Payer: Managed Care, Other (non HMO) | Admitting: Family Medicine

## 2015-10-09 ENCOUNTER — Encounter: Payer: Self-pay | Admitting: Family Medicine

## 2015-10-09 VITALS — BP 110/72 | HR 100 | Temp 99.1°F | Ht 75.0 in | Wt 204.4 lb

## 2015-10-09 DIAGNOSIS — E119 Type 2 diabetes mellitus without complications: Secondary | ICD-10-CM

## 2015-10-09 MED ORDER — EZETIMIBE 10 MG PO TABS: 10.0000 mg | ORAL_TABLET | Freq: Every day | ORAL | Status: DC

## 2015-10-09 NOTE — Progress Notes (Signed)
Pre visit review using our clinic review tool, if applicable. No additional management support is needed unless otherwise documented below in the visit note. 

## 2015-10-09 NOTE — Progress Notes (Signed)
   Subjective:    Patient ID: Luis Ross, male    DOB: 09-09-1950, 65 y.o.   MRN: [id number]  HPI Follow-up type 2 diabetes and dyslipidemia. He started back Zetia.  Previously had some leg cramps on this Blood sugars have been stable. Not exercising regularly. Brings in copy of labs that were done yesterday. A1c 6.0%. LDL cholesterol 93 which is up from previous of 74 when he was taking lipid therapy. He has not been no tolerate any statins. He is getting regular eye exams. No chest pains or other complaints.  Past Medical History  Diagnosis Date  . HYPERLIPIDEMIA 02/26/2010   Past Surgical History  Procedure Laterality Date  . Ganglion cyst excision  1964    left wrist    reports that he quit smoking about 3 years ago. His smoking use included Cigarettes. He has a 5 pack-year smoking history. He does not have any smokeless tobacco history on file. His alcohol and drug histories are not on file. family history includes Alcohol abuse in his father; Heart disease in his mother; Hypertension in his mother. Allergies  Allergen Reactions  . Crestor [Rosuvastatin Calcium]     myalgias      Review of Systems  Constitutional: Negative for fatigue.  Eyes: Negative for visual disturbance.  Respiratory: Negative for cough, chest tightness and shortness of breath.   Cardiovascular: Negative for chest pain, palpitations and leg swelling.  Neurological: Negative for dizziness, syncope, weakness, light-headedness and headaches.       Objective:   Physical Exam  Constitutional: He is oriented to person, place, and time. He appears well-developed and well-nourished.  HENT:  Right Ear: External ear normal.  Left Ear: External ear normal.  Mouth/Throat: Oropharynx is clear and moist.  Eyes: Pupils are equal, round, and reactive to light.  Neck: Neck supple. No thyromegaly present.  Cardiovascular: Normal rate and regular rhythm.   Pulmonary/Chest: Effort normal and breath sounds  normal. No respiratory distress. He has no wheezes. He has no rales.  Musculoskeletal: He exhibits no edema.  Neurological: He is alert and oriented to person, place, and time.          Assessment & Plan:  Type 2 diabetes. Well controlled. A1c 6.0%. Continue Invokana.  Get back to more consistent exercise.  Dyslipidemia. Refill Zetia for one year.  Kristian CoveyBruce W Mei Suits MD [facility]

## 2016-01-04 ENCOUNTER — Telehealth: Payer: Self-pay | Admitting: Family Medicine

## 2016-01-04 DIAGNOSIS — E119 Type 2 diabetes mellitus without complications: Secondary | ICD-10-CM

## 2016-01-04 NOTE — Telephone Encounter (Signed)
Pt states he no longer will be getting labs at his office, and always gets prior to his appointmnet with Dr Caryl NeverBurchette. Pt is requesting a lipid panel, cmp, and a1c. Ok to schedule? Pt has appointment on 9/22 with dr burchette    ALSO, Pt needs refill of lisinopril (PRINIVIL,ZESTRIL) 5 MG tablet doxycycline (VIBRAMYCIN) 100 MG capsule  90 day [facility] [place], [place]

## 2016-01-05 MED ORDER — LISINOPRIL 5 MG PO TABS: 5.0000 mg | ORAL_TABLET | Freq: Every day | ORAL | 2 refills | Status: DC

## 2016-01-05 MED ORDER — DOXYCYCLINE HYCLATE 100 MG PO CAPS: 100.0000 mg | ORAL_CAPSULE | Freq: Every day | ORAL | 2 refills | Status: DC

## 2016-01-05 NOTE — Telephone Encounter (Signed)
Pending appt is a 3 month follow up. His lab appt is scheduled for 01/07/2016. Okay to order?

## 2016-01-05 NOTE — Telephone Encounter (Signed)
OK to order.  Go ahead and refill requested meds for one year.

## 2016-01-05 NOTE — Telephone Encounter (Signed)
Orders are entered and medications refilled.

## 2016-01-07 ENCOUNTER — Encounter: Payer: Self-pay | Admitting: Family Medicine

## 2016-01-07 ENCOUNTER — Other Ambulatory Visit (INDEPENDENT_AMBULATORY_CARE_PROVIDER_SITE_OTHER): Payer: Managed Care, Other (non HMO)

## 2016-01-07 DIAGNOSIS — E119 Type 2 diabetes mellitus without complications: Secondary | ICD-10-CM

## 2016-01-07 LAB — COMPREHENSIVE METABOLIC PANEL WITH GFR
ALT: 19 U/L (ref 0–53)
AST: 16 U/L (ref 0–37)
Albumin: 4.5 g/dL (ref 3.5–5.2)
Alkaline Phosphatase: 46 U/L (ref 39–117)
BUN: 18 mg/dL (ref 6–23)
CO2: 27 meq/L (ref 19–32)
Calcium: 9.3 mg/dL (ref 8.4–10.5)
Chloride: 104 meq/L (ref 96–112)
Creatinine, Ser: 0.82 mg/dL (ref 0.40–1.50)
GFR: 100.73 mL/min
Glucose, Bld: 123 mg/dL — ABNORMAL HIGH (ref 70–99)
Potassium: 4.2 meq/L (ref 3.5–5.1)
Sodium: 139 meq/L (ref 135–145)
Total Bilirubin: 1.2 mg/dL (ref 0.2–1.2)
Total Protein: 7.4 g/dL (ref 6.0–8.3)

## 2016-01-07 LAB — LIPID PANEL
Cholesterol: 126 mg/dL (ref 0–200)
HDL: 42.9 mg/dL
LDL Cholesterol: 69 mg/dL (ref 0–99)
NonHDL: 82.61
Total CHOL/HDL Ratio: 3
Triglycerides: 70 mg/dL (ref 0.0–149.0)
VLDL: 14 mg/dL (ref 0.0–40.0)

## 2016-01-07 LAB — HEMOGLOBIN A1C: Hgb A1c MFr Bld: 5.9 % (ref 4.6–6.5)

## 2016-01-15 ENCOUNTER — Ambulatory Visit (INDEPENDENT_AMBULATORY_CARE_PROVIDER_SITE_OTHER): Payer: Managed Care, Other (non HMO) | Admitting: Family Medicine

## 2016-01-15 ENCOUNTER — Encounter: Payer: Self-pay | Admitting: Family Medicine

## 2016-01-15 VITALS — BP 104/62 | HR 94 | Temp 98.4°F | Ht 75.0 in | Wt 204.8 lb

## 2016-01-15 DIAGNOSIS — E119 Type 2 diabetes mellitus without complications: Secondary | ICD-10-CM | POA: Diagnosis not present

## 2016-01-15 DIAGNOSIS — L719 Rosacea, unspecified: Secondary | ICD-10-CM | POA: Diagnosis not present

## 2016-01-15 MED ORDER — DOXYCYCLINE HYCLATE 100 MG PO CAPS: 100.0000 mg | ORAL_CAPSULE | Freq: Two times a day (BID) | ORAL | 3 refills | Status: DC

## 2016-01-15 NOTE — Progress Notes (Signed)
Subjective:     Patient ID: Luis Ross, male   DOB: March 25, 1951, 65 y.o.   MRN: [id number]  HPI Patient seen for routine medical follow-up. He has type 2 diabetes and dyslipidemia. He's been intolerant of statins and has recently gone back on low-dose Zetia-but even with that feels he has contributed to some frequent leg cramps  He has history of rosacea and takes doxycycline as needed. Requesting refills. He is aware of potential for sun sensitivity. He plans to get flu vaccine through work. No recent chest pains or other complaints  Past Medical History:  Diagnosis Date  . HYPERLIPIDEMIA 02/26/2010   Past Surgical History:  Procedure Laterality Date  . GANGLION CYST EXCISION  1964   left wrist    reports that he quit smoking about 4 years ago. His smoking use included Cigarettes. He has a 5.00 pack-year smoking history. He does not have any smokeless tobacco history on file. His alcohol and drug histories are not on file. family history includes Alcohol abuse in his father; Heart disease in his mother; Hypertension in his mother. Allergies  Allergen Reactions  . Crestor [Rosuvastatin Calcium]     myalgias     Review of Systems  Constitutional: Negative for fatigue.  Eyes: Negative for visual disturbance.  Respiratory: Negative for cough, chest tightness and shortness of breath.   Cardiovascular: Negative for chest pain, palpitations and leg swelling.  Endocrine: Negative for polydipsia and polyuria.  Neurological: Negative for dizziness, syncope, weakness, light-headedness and headaches.       Objective:   Physical Exam  Constitutional: He is oriented to person, place, and time. He appears well-developed and well-nourished.  HENT:  Right Ear: External ear normal.  Left Ear: External ear normal.  Mouth/Throat: Oropharynx is clear and moist.  Eyes: Pupils are equal, round, and reactive to light.  Neck: Neck supple. No thyromegaly present.  Cardiovascular: Normal rate  and regular rhythm.   Pulmonary/Chest: Effort normal and breath sounds normal. No respiratory distress. He has no wheezes. He has no rales.  Musculoskeletal: He exhibits no edema.  Neurological: He is alert and oriented to person, place, and time.  Skin: Rash noted.  Patient has a couple of erythematous papules and one pustule on the nose       Assessment:        #1 type 2 diabetes. Excellent control with recent A1c 5.9%  #2 dyslipidemia stable  #3 rosacea Plan:     -Refill doxycycline for as needed use -recommendation for flu vaccine which he plans to get at work -Schedule physical in 3 months and planned full labs at that point including PSA  Kristian CoveyBruce W Joseluis Alessio MD [facility]

## 2016-01-15 NOTE — Progress Notes (Signed)
Pre visit review using our clinic review tool, if applicable. No additional management support is needed unless otherwise documented below in the visit note. 

## 2016-01-17 DIAGNOSIS — L719 Rosacea, unspecified: Secondary | ICD-10-CM | POA: Insufficient documentation

## 2016-04-22 ENCOUNTER — Other Ambulatory Visit (INDEPENDENT_AMBULATORY_CARE_PROVIDER_SITE_OTHER): Payer: Managed Care, Other (non HMO)

## 2016-04-22 DIAGNOSIS — Z0000 Encounter for general adult medical examination without abnormal findings: Secondary | ICD-10-CM

## 2016-04-22 LAB — LIPID PANEL
CHOL/HDL RATIO: 3
Cholesterol: 130 mg/dL (ref 0–200)
HDL: 40.2 mg/dL (ref >39.00)
LDL CALC: 74 mg/dL (ref 0–99)
NONHDL: 89.6
Triglycerides: 76 mg/dL (ref 0.0–149.0)
VLDL: 15.2 mg/dL (ref 0.0–40.0)

## 2016-04-22 LAB — CBC WITH DIFFERENTIAL/PLATELET
BASOS PCT: 0.5 % (ref 0.0–3.0)
Basophils Absolute: 0 10*3/uL (ref 0.0–0.1)
EOS PCT: 1.5 % (ref 0.0–5.0)
Eosinophils Absolute: 0.1 10*3/uL (ref 0.0–0.7)
HCT: 49.5 % (ref 39.0–52.0)
Hemoglobin: 17.3 g/dL — ABNORMAL HIGH (ref 13.0–17.0)
LYMPHS ABS: 1.8 10*3/uL (ref 0.7–4.0)
Lymphocytes Relative: 32.8 % (ref 12.0–46.0)
MCHC: 34.9 g/dL (ref 30.0–36.0)
MCV: 93.1 fl (ref 78.0–100.0)
MONO ABS: 0.6 10*3/uL (ref 0.1–1.0)
Monocytes Relative: 10.2 % (ref 3.0–12.0)
NEUTROS ABS: 3.1 10*3/uL (ref 1.4–7.7)
NEUTROS PCT: 55 % (ref 43.0–77.0)
PLATELETS: 212 10*3/uL (ref 150.0–400.0)
RBC: 5.32 Mil/uL (ref 4.22–5.81)
RDW: 12.7 % (ref 11.5–15.5)
WBC: 5.6 10*3/uL (ref 4.0–10.5)

## 2016-04-22 LAB — MICROALBUMIN / CREATININE URINE RATIO
CREATININE, U: 136.8 mg/dL
MICROALB UR: 1.4 mg/dL (ref 0.0–1.9)
MICROALB/CREAT RATIO: 1 mg/g (ref 0.0–30.0)

## 2016-04-22 LAB — BASIC METABOLIC PANEL
BUN: 20 mg/dL (ref 6–23)
CHLORIDE: 105 meq/L (ref 96–112)
CO2: 26 meq/L (ref 19–32)
Calcium: 9.3 mg/dL (ref 8.4–10.5)
Creatinine, Ser: 0.83 mg/dL (ref 0.40–1.50)
GFR: 99.23 mL/min (ref >60.00)
GLUCOSE: 120 mg/dL — AB (ref 70–99)
POTASSIUM: 4.4 meq/L (ref 3.5–5.1)
Sodium: 140 mEq/L (ref 135–145)

## 2016-04-22 LAB — HEPATIC FUNCTION PANEL
ALT: 17 U/L (ref 0–53)
AST: 15 U/L (ref 0–37)
Albumin: 4.6 g/dL (ref 3.5–5.2)
Alkaline Phosphatase: 50 U/L (ref 39–117)
BILIRUBIN DIRECT: 0.2 mg/dL (ref 0.0–0.3)
BILIRUBIN TOTAL: 0.8 mg/dL (ref 0.2–1.2)
Total Protein: 7 g/dL (ref 6.0–8.3)

## 2016-04-22 LAB — TSH: TSH: 1.32 u[IU]/mL (ref 0.35–4.50)

## 2016-04-22 LAB — HEMOGLOBIN A1C: HEMOGLOBIN A1C: 5.9 % (ref 4.6–6.5)

## 2016-04-22 LAB — PSA: PSA: 0.3 ng/mL (ref 0.10–4.00)

## 2016-04-29 ENCOUNTER — Other Ambulatory Visit: Payer: Self-pay

## 2016-04-29 ENCOUNTER — Ambulatory Visit (INDEPENDENT_AMBULATORY_CARE_PROVIDER_SITE_OTHER): Payer: Managed Care, Other (non HMO) | Admitting: Family Medicine

## 2016-04-29 ENCOUNTER — Encounter: Payer: Self-pay | Admitting: Family Medicine

## 2016-04-29 ENCOUNTER — Telehealth: Payer: Self-pay | Admitting: Family Medicine

## 2016-04-29 VITALS — BP 120/80 | HR 196 | Temp 98.3°F | Ht 74.0 in | Wt 202.3 lb

## 2016-04-29 DIAGNOSIS — Z0000 Encounter for general adult medical examination without abnormal findings: Secondary | ICD-10-CM

## 2016-04-29 MED ORDER — CANAGLIFLOZIN 300 MG PO TABS: 300.0000 mg | ORAL_TABLET | Freq: Every day | ORAL | 3 refills | Status: DC

## 2016-04-29 NOTE — Telephone Encounter (Signed)
Pharmacy would like to see if you could resend Invokana to [facility] [address]

## 2016-04-29 NOTE — Telephone Encounter (Signed)
Script was re-sent to pharmacy

## 2016-04-29 NOTE — Progress Notes (Signed)
Subjective:     Patient ID: Luis Ross, male   DOB: 12/18/1950, 66 y.o.   MRN: [id number]  HPI   Patient here for physical exam. He has history of type 2 diabetes, rosacea, hyperlipidemia. [place]coming trip to [place]. He has questions about yellow fever vaccine. He had seen some recent data that recommended against yellow fever vaccine for folks over 60. However, he is not allowed to enter country above without vaccine. He does not call previous vaccination for yellow fever.  Currently not exercising much. No chest pains. No dyspnea. Appetite and weight stable. Immunizations up-to-date. He's had previous Pneumovax. Has not had eye exam in over one year  Past Medical History:  Diagnosis Date  . HYPERLIPIDEMIA 02/26/2010   Past Surgical History:  Procedure Laterality Date  . GANGLION CYST EXCISION  1964   left wrist    reports that he quit smoking about 4 years ago. His smoking use included Cigarettes. He has a 5.00 pack-year smoking history. He has never used smokeless tobacco. He reports that he does not drink alcohol or use drugs. family history includes Alcohol abuse in his father; Heart disease in his mother; Hypertension in his mother. Allergies  Allergen Reactions  . Crestor [Rosuvastatin Calcium]     myalgias  . Metformin And Related Diarrhea     Review of Systems  Constitutional: Negative for activity change, appetite change, fatigue and fever.  HENT: Negative for congestion, ear pain and trouble swallowing.   Eyes: Negative for pain and visual disturbance.  Respiratory: Negative for cough, shortness of breath and wheezing.   Cardiovascular: Negative for chest pain and palpitations.  Gastrointestinal: Negative for abdominal distention, abdominal pain, blood in stool, constipation, diarrhea, nausea, rectal pain and vomiting.  Genitourinary: Negative for dysuria, hematuria and testicular pain.  Musculoskeletal: Negative for arthralgias and joint swelling.  Skin: Negative  for rash.  Neurological: Negative for dizziness, syncope and headaches.  Hematological: Negative for adenopathy.  Psychiatric/Behavioral: Negative for confusion and dysphoric mood.       Objective:   Physical Exam  Constitutional: He is oriented to person, place, and time. He appears well-developed and well-nourished. No distress.  HENT:  Head: Normocephalic and atraumatic.  Right Ear: External ear normal.  Left Ear: External ear normal.  Mouth/Throat: Oropharynx is clear and moist.  Eyes: Conjunctivae and EOM are normal. Pupils are equal, round, and reactive to light.  Neck: Normal range of motion. Neck supple. No thyromegaly present.  Cardiovascular: Normal rate, regular rhythm and normal heart sounds.   No murmur heard. Pulmonary/Chest: No respiratory distress. He has no wheezes. He has no rales.  Abdominal: Soft. Bowel sounds are normal. He exhibits no distension and no mass. There is no tenderness. There is no rebound and no guarding.  Genitourinary: Rectum normal and prostate normal.  Musculoskeletal: He exhibits no edema.  Lymphadenopathy:    He has no cervical adenopathy.  Neurological: He is alert and oriented to person, place, and time. He displays normal reflexes. No cranial nerve deficit.  Skin: No rash noted.  Psychiatric: He has a normal mood and affect.       Assessment:     Physical exam. Labs reviewed with no major concerns. His diabetes has been very well controlled. He has been intolerant of statins. Lipids are controlled with Zetia.    Plan:     -Continue with current medications -Establish more consistent exercise -Overdue for eye exam and plans to set this up soon  [name] MD  [facility] at [facility]

## 2016-04-29 NOTE — Progress Notes (Signed)
Pre visit review using our clinic review tool, if applicable. No additional management support is needed unless otherwise documented below in the visit note. 

## 2016-05-02 ENCOUNTER — Telehealth: Payer: Self-pay

## 2016-05-02 NOTE — Telephone Encounter (Signed)
[facility] for Invokana 300 mg tablets. PA submitted & is pending. Key: [id number]

## 2016-05-02 NOTE — Telephone Encounter (Signed)
PA approved, form faxed back to pharmacy. 

## 2016-07-27 ENCOUNTER — Other Ambulatory Visit: Payer: Self-pay | Admitting: Family Medicine

## 2016-07-27 DIAGNOSIS — E119 Type 2 diabetes mellitus without complications: Secondary | ICD-10-CM

## 2016-07-28 ENCOUNTER — Other Ambulatory Visit (INDEPENDENT_AMBULATORY_CARE_PROVIDER_SITE_OTHER): Payer: Managed Care, Other (non HMO)

## 2016-07-28 DIAGNOSIS — E119 Type 2 diabetes mellitus without complications: Secondary | ICD-10-CM

## 2016-07-28 DIAGNOSIS — E785 Hyperlipidemia, unspecified: Secondary | ICD-10-CM | POA: Diagnosis not present

## 2016-07-28 LAB — HEMOGLOBIN A1C: HEMOGLOBIN A1C: 6.2 % (ref 4.6–6.5)

## 2016-07-28 LAB — LIPID PANEL
Cholesterol: 144 mg/dL (ref 0–200)
HDL: 42.1 mg/dL (ref >39.00)
LDL Cholesterol: 84 mg/dL (ref 0–99)
NonHDL: 101.67
TRIGLYCERIDES: 90 mg/dL (ref 0.0–149.0)
Total CHOL/HDL Ratio: 3
VLDL: 18 mg/dL (ref 0.0–40.0)

## 2016-07-28 LAB — BASIC METABOLIC PANEL
BUN: 14 mg/dL (ref 6–23)
CALCIUM: 9.5 mg/dL (ref 8.4–10.5)
CO2: 27 mEq/L (ref 19–32)
CREATININE: 0.87 mg/dL (ref 0.40–1.50)
Chloride: 105 mEq/L (ref 96–112)
GFR: 93.91 mL/min (ref >60.00)
Glucose, Bld: 146 mg/dL — ABNORMAL HIGH (ref 70–99)
Potassium: 4.5 mEq/L (ref 3.5–5.1)
Sodium: 139 mEq/L (ref 135–145)

## 2016-08-05 ENCOUNTER — Ambulatory Visit (INDEPENDENT_AMBULATORY_CARE_PROVIDER_SITE_OTHER): Payer: Managed Care, Other (non HMO) | Admitting: Family Medicine

## 2016-08-05 ENCOUNTER — Encounter: Payer: Self-pay | Admitting: Family Medicine

## 2016-08-05 VITALS — BP 110/64 | HR 103 | Temp 99.2°F | Wt 204.7 lb

## 2016-08-05 DIAGNOSIS — E119 Type 2 diabetes mellitus without complications: Secondary | ICD-10-CM | POA: Diagnosis not present

## 2016-08-05 DIAGNOSIS — E785 Hyperlipidemia, unspecified: Secondary | ICD-10-CM

## 2016-08-05 MED ORDER — EZETIMIBE 10 MG PO TABS: 10.0000 mg | ORAL_TABLET | Freq: Every day | ORAL | 3 refills | Status: DC

## 2016-08-05 MED ORDER — LISINOPRIL 5 MG PO TABS: 5.0000 mg | ORAL_TABLET | Freq: Every day | ORAL | 3 refills | Status: DC

## 2016-08-05 MED ORDER — DOXYCYCLINE HYCLATE 100 MG PO CAPS: 100.0000 mg | ORAL_CAPSULE | Freq: Two times a day (BID) | ORAL | 3 refills | Status: DC

## 2016-08-05 NOTE — Progress Notes (Signed)
Pre visit review using our clinic review tool, if applicable. No additional management support is needed unless otherwise documented below in the visit note. 

## 2016-08-05 NOTE — Progress Notes (Signed)
Subjective:     Patient ID: Luis Ross, male   DOB: [birth year], 66 y.o.   MRN: [id number]  HPI Follow-up type 2 diabetes and dyslipidemia. Intolerant of multiple statins. He does take Zetia.  Recent labs reviewed. Lipids are stable. Hemoglobin A1c 6.2%. Electrolytes normal. His weight is up 2 pounds from last visit. No recent chest pains or other complaints. He needs several refills today including lisinopril and doxycycline. He has history of rosacea and also upcoming trip to [place] and will take the doxycycline for malaria prevention. He is aware of photosensitivity risk.  Past Medical History:  Diagnosis Date  . HYPERLIPIDEMIA 02/26/2010   Past Surgical History:  Procedure Laterality Date  . GANGLION CYST EXCISION  1964   left wrist    reports that he quit smoking about 4 years ago. His smoking use included Cigarettes. He has a 5.00 pack-year smoking history. He has never used smokeless tobacco. He reports that he does not drink alcohol or use drugs. family history includes Alcohol abuse in his father; Heart disease in his mother; Hypertension in his mother. Allergies  Allergen Reactions  . Crestor [Rosuvastatin Calcium]     myalgias  . Metformin And Related Diarrhea     Review of Systems  Constitutional: Negative for fatigue.  Eyes: Negative for visual disturbance.  Respiratory: Negative for cough, chest tightness and shortness of breath.   Cardiovascular: Negative for chest pain, palpitations and leg swelling.  Neurological: Negative for dizziness, syncope, weakness, light-headedness and headaches.       Objective:   Physical Exam  Constitutional: He is oriented to person, place, and time. He appears well-developed and well-nourished.  HENT:  Right Ear: External ear normal.  Left Ear: External ear normal.  Mouth/Throat: Oropharynx is clear and moist.  Eyes: Pupils are equal, round, and reactive to light.  Neck: Neck supple. No thyromegaly present.  Cardiovascular:  Normal rate and regular rhythm.   Pulmonary/Chest: Effort normal and breath sounds normal. No respiratory distress. He has no wheezes. He has no rales.  Musculoskeletal: He exhibits no edema.  Neurological: He is alert and oriented to person, place, and time.  Psychiatric: He has a normal mood and affect. His behavior is normal.       Assessment:     #1 type 2 diabetes well controlled with recent A1c 6.2%  #2 dyslipidemia    Plan:     -Refill medications for 3 months -Routine follow-up in 3 months  Kristian Covey MD [facility]

## 2016-11-18 ENCOUNTER — Other Ambulatory Visit (INDEPENDENT_AMBULATORY_CARE_PROVIDER_SITE_OTHER): Payer: Managed Care, Other (non HMO)

## 2016-11-18 DIAGNOSIS — E785 Hyperlipidemia, unspecified: Secondary | ICD-10-CM | POA: Diagnosis not present

## 2016-11-18 DIAGNOSIS — E119 Type 2 diabetes mellitus without complications: Secondary | ICD-10-CM | POA: Diagnosis not present

## 2016-11-18 LAB — LIPID PANEL
CHOLESTEROL: 134 mg/dL (ref 0–200)
HDL: 39.2 mg/dL (ref >39.00)
LDL CALC: 80 mg/dL (ref 0–99)
NonHDL: 95.14
TRIGLYCERIDES: 77 mg/dL (ref 0.0–149.0)
Total CHOL/HDL Ratio: 3
VLDL: 15.4 mg/dL (ref 0.0–40.0)

## 2016-11-18 LAB — COMPREHENSIVE METABOLIC PANEL
ALBUMIN: 4.5 g/dL (ref 3.5–5.2)
ALT: 20 U/L (ref 0–53)
AST: 15 U/L (ref 0–37)
Alkaline Phosphatase: 41 U/L (ref 39–117)
BUN: 19 mg/dL (ref 6–23)
CO2: 29 mEq/L (ref 19–32)
Calcium: 9.6 mg/dL (ref 8.4–10.5)
Chloride: 103 mEq/L (ref 96–112)
Creatinine, Ser: 0.86 mg/dL (ref 0.40–1.50)
GFR: 95.08 mL/min (ref >60.00)
GLUCOSE: 123 mg/dL — AB (ref 70–99)
POTASSIUM: 4.3 meq/L (ref 3.5–5.1)
SODIUM: 139 meq/L (ref 135–145)
Total Bilirubin: 1 mg/dL (ref 0.2–1.2)
Total Protein: 7.1 g/dL (ref 6.0–8.3)

## 2016-11-18 LAB — HEMOGLOBIN A1C: HEMOGLOBIN A1C: 6.3 % (ref 4.6–6.5)

## 2016-11-20 ENCOUNTER — Encounter: Payer: Self-pay | Admitting: Family Medicine

## 2016-11-25 ENCOUNTER — Encounter: Payer: Self-pay | Admitting: Family Medicine

## 2016-11-25 ENCOUNTER — Ambulatory Visit (INDEPENDENT_AMBULATORY_CARE_PROVIDER_SITE_OTHER): Payer: Managed Care, Other (non HMO) | Admitting: Family Medicine

## 2016-11-25 VITALS — BP 110/68 | HR 81 | Temp 99.2°F | Wt 211.2 lb

## 2016-11-25 DIAGNOSIS — E785 Hyperlipidemia, unspecified: Secondary | ICD-10-CM | POA: Diagnosis not present

## 2016-11-25 DIAGNOSIS — E119 Type 2 diabetes mellitus without complications: Secondary | ICD-10-CM | POA: Diagnosis not present

## 2016-11-26 NOTE — Progress Notes (Signed)
Subjective:     Patient ID: Luis Ross, male   DOB: 06-Sep-1950, 66 y.o.   MRN: [id number]  HPI Pt seen for follow up type 2 diabetes and hyperlipidemia.  jsut returned recently from trip to [place].  Gained a few pounds over there.  Glucose has been stable.  A1C 6.3%.    Recent lipids stable.  Has had myalgia in past with statins.  Currently on Zetia.  Is contemplating trying low dose Pravastatin again.  Past Medical History:  Diagnosis Date  . HYPERLIPIDEMIA 02/26/2010   Past Surgical History:  Procedure Laterality Date  . GANGLION CYST EXCISION  1964   left wrist    reports that he quit smoking about 5 years ago. His smoking use included Cigarettes. He has a 5.00 pack-year smoking history. He has never used smokeless tobacco. He reports that he does not drink alcohol or use drugs. family history includes Alcohol abuse in his father; Heart disease in his mother; Hypertension in his mother. Allergies  Allergen Reactions  . Crestor [Rosuvastatin Calcium]     myalgias  . Metformin And Related Diarrhea     Review of Systems  Constitutional: Negative for fatigue.  Eyes: Negative for visual disturbance.  Respiratory: Negative for cough, chest tightness and shortness of breath.   Cardiovascular: Negative for chest pain, palpitations and leg swelling.  Endocrine: Negative for polydipsia and polyuria.  Neurological: Negative for dizziness, syncope, weakness, light-headedness and headaches.       Objective:   Physical Exam  Constitutional: He appears well-developed and well-nourished.  Cardiovascular: Normal rate and regular rhythm.   Pulmonary/Chest: Effort normal and breath sounds normal. No respiratory distress. He has no wheezes. He has no rales.  Musculoskeletal: He exhibits no edema.       Assessment:     #1 Type 2 diabetes well controlled.  #2 hyperlipidemia.    Plan:     -try starting back Pravachol 20 mg daily -ordered future labs with lipids, CMP, A1C in 3  months -set up eye exam  Kristian CoveyBruce W Gentri Guardado MD [facility]

## 2017-01-12 ENCOUNTER — Encounter: Payer: Self-pay | Admitting: Family Medicine

## 2017-02-03 LAB — HM DIABETES EYE EXAM

## 2017-02-17 ENCOUNTER — Other Ambulatory Visit (INDEPENDENT_AMBULATORY_CARE_PROVIDER_SITE_OTHER): Payer: Managed Care, Other (non HMO)

## 2017-02-17 DIAGNOSIS — E119 Type 2 diabetes mellitus without complications: Secondary | ICD-10-CM | POA: Diagnosis not present

## 2017-02-17 DIAGNOSIS — E785 Hyperlipidemia, unspecified: Secondary | ICD-10-CM

## 2017-02-17 LAB — LIPID PANEL
CHOL/HDL RATIO: 3
Cholesterol: 127 mg/dL (ref 0–200)
HDL: 40.3 mg/dL (ref >39.00)
LDL Cholesterol: 73 mg/dL (ref 0–99)
NONHDL: 86.64
Triglycerides: 70 mg/dL (ref 0.0–149.0)
VLDL: 14 mg/dL (ref 0.0–40.0)

## 2017-02-17 LAB — COMPREHENSIVE METABOLIC PANEL
ALT: 19 U/L (ref 0–53)
AST: 16 U/L (ref 0–37)
Albumin: 4.6 g/dL (ref 3.5–5.2)
Alkaline Phosphatase: 45 U/L (ref 39–117)
BUN: 18 mg/dL (ref 6–23)
CHLORIDE: 104 meq/L (ref 96–112)
CO2: 27 meq/L (ref 19–32)
CREATININE: 0.76 mg/dL (ref 0.40–1.50)
Calcium: 9.4 mg/dL (ref 8.4–10.5)
GFR: 109.57 mL/min (ref >60.00)
GLUCOSE: 141 mg/dL — AB (ref 70–99)
Potassium: 4.3 mEq/L (ref 3.5–5.1)
SODIUM: 140 meq/L (ref 135–145)
Total Bilirubin: 0.8 mg/dL (ref 0.2–1.2)
Total Protein: 7 g/dL (ref 6.0–8.3)

## 2017-02-17 LAB — HEMOGLOBIN A1C: Hgb A1c MFr Bld: 6.3 % (ref 4.6–6.5)

## 2017-02-24 ENCOUNTER — Ambulatory Visit: Payer: Managed Care, Other (non HMO) | Admitting: Family Medicine

## 2017-03-03 ENCOUNTER — Ambulatory Visit: Payer: Managed Care, Other (non HMO) | Admitting: Family Medicine

## 2017-03-10 ENCOUNTER — Ambulatory Visit: Payer: Managed Care, Other (non HMO) | Admitting: Family Medicine

## 2017-03-10 ENCOUNTER — Encounter: Payer: Self-pay | Admitting: Family Medicine

## 2017-03-10 VITALS — BP 130/80 | HR 106 | Temp 98.4°F | Wt 213.3 lb

## 2017-03-10 DIAGNOSIS — E1169 Type 2 diabetes mellitus with other specified complication: Secondary | ICD-10-CM | POA: Diagnosis not present

## 2017-03-10 DIAGNOSIS — E785 Hyperlipidemia, unspecified: Secondary | ICD-10-CM | POA: Diagnosis not present

## 2017-03-10 DIAGNOSIS — E119 Type 2 diabetes mellitus without complications: Secondary | ICD-10-CM

## 2017-03-10 MED ORDER — LOSARTAN POTASSIUM 25 MG PO TABS: 25.0000 mg | ORAL_TABLET | Freq: Every day | ORAL | 3 refills | Status: DC

## 2017-03-10 NOTE — Progress Notes (Signed)
Subjective:     Patient ID: Luis Ross, male   DOB: August 13, 1950, 66 y.o.   MRN: [id number]  HPI Patient seen for medical follow-up. He has history of type 2 diabetes and dyslipidemia. Diabetes been well controlled. Recent labs reviewed with A1c 6.3%. He still walks for exercise.  Developed recent dry cough. He had been on low-dose lisinopril. He stopped lisinopril and cough resolved. He started lisinopril back and cough returned. He was taking lisinopril predominantly for renal protection. His blood pressures been relatively stable. Cough is currently stable off lisinopril. No recent hemoptysis.  Recent lipids were reassessed and reviewed with patient. No recent chest pains.  Past Medical History:  Diagnosis Date  . HYPERLIPIDEMIA 02/26/2010   Past Surgical History:  Procedure Laterality Date  . GANGLION CYST EXCISION  1964   left wrist    reports that he quit smoking about 5 years ago. His smoking use included cigarettes. He has a 5.00 pack-year smoking history. he has never used smokeless tobacco. He reports that he does not drink alcohol or use drugs. family history includes Alcohol abuse in his father; Heart disease in his mother; Hypertension in his mother. Allergies  Allergen Reactions  . Crestor [Rosuvastatin Calcium]     myalgias  . Metformin And Related Diarrhea     Review of Systems  Constitutional: Negative for fatigue.  Eyes: Negative for visual disturbance.  Respiratory: Negative for cough, chest tightness and shortness of breath.   Cardiovascular: Negative for chest pain, palpitations and leg swelling.  Neurological: Negative for dizziness, syncope, weakness, light-headedness and headaches.       Objective:   Physical Exam  Constitutional: He is oriented to person, place, and time. He appears well-developed and well-nourished.  HENT:  Right Ear: External ear normal.  Left Ear: External ear normal.  Mouth/Throat: Oropharynx is clear and moist.  Eyes:  Pupils are equal, round, and reactive to light.  Neck: Neck supple. No thyromegaly present.  Cardiovascular: Normal rate and regular rhythm.  Pulmonary/Chest: Effort normal and breath sounds normal. No respiratory distress. He has no wheezes. He has no rales.  Musculoskeletal: He exhibits no edema.  Neurological: He is alert and oriented to person, place, and time.       Assessment:     #1 probable ACE inhibitor related cough  #2 type 2 diabetes well controlled  #3 dyslipidemia    Plan:     -Start losartan 25 mg once daily -Ordered future labs with A1c, comprehensive metabolic panel, lipid panel for 3 months  Kristian CoveyBruce W Mead Slane MD [facility]

## 2017-06-08 ENCOUNTER — Other Ambulatory Visit (INDEPENDENT_AMBULATORY_CARE_PROVIDER_SITE_OTHER): Payer: Managed Care, Other (non HMO)

## 2017-06-08 ENCOUNTER — Encounter: Payer: Self-pay | Admitting: Family Medicine

## 2017-06-08 DIAGNOSIS — E1169 Type 2 diabetes mellitus with other specified complication: Secondary | ICD-10-CM | POA: Diagnosis not present

## 2017-06-08 DIAGNOSIS — E785 Hyperlipidemia, unspecified: Secondary | ICD-10-CM

## 2017-06-08 LAB — COMPREHENSIVE METABOLIC PANEL
ALT: 20 U/L (ref 0–53)
AST: 14 U/L (ref 0–37)
Albumin: 4.3 g/dL (ref 3.5–5.2)
Alkaline Phosphatase: 43 U/L (ref 39–117)
BUN: 21 mg/dL (ref 6–23)
CO2: 25 meq/L (ref 19–32)
Calcium: 8.8 mg/dL (ref 8.4–10.5)
Chloride: 101 mEq/L (ref 96–112)
Creatinine, Ser: 0.97 mg/dL (ref 0.40–1.50)
GFR: 82.6 mL/min (ref >60.00)
Glucose, Bld: 144 mg/dL — ABNORMAL HIGH (ref 70–99)
POTASSIUM: 4.2 meq/L (ref 3.5–5.1)
Sodium: 138 mEq/L (ref 135–145)
Total Bilirubin: 1.1 mg/dL (ref 0.2–1.2)
Total Protein: 7 g/dL (ref 6.0–8.3)

## 2017-06-08 LAB — LIPID PANEL
Cholesterol: 116 mg/dL (ref 0–200)
HDL: 36.7 mg/dL — AB (ref >39.00)
LDL CALC: 62 mg/dL (ref 0–99)
NonHDL: 79.44
TRIGLYCERIDES: 88 mg/dL (ref 0.0–149.0)
Total CHOL/HDL Ratio: 3
VLDL: 17.6 mg/dL (ref 0.0–40.0)

## 2017-06-08 LAB — HEMOGLOBIN A1C: Hgb A1c MFr Bld: 6.6 % — ABNORMAL HIGH (ref 4.6–6.5)

## 2017-06-16 ENCOUNTER — Ambulatory Visit: Payer: Managed Care, Other (non HMO) | Admitting: Family Medicine

## 2017-06-16 ENCOUNTER — Encounter: Payer: Self-pay | Admitting: Family Medicine

## 2017-06-16 VITALS — BP 110/68 | HR 100 | Temp 98.3°F | Wt 219.0 lb

## 2017-06-16 DIAGNOSIS — Z0000 Encounter for general adult medical examination without abnormal findings: Secondary | ICD-10-CM

## 2017-06-16 DIAGNOSIS — E119 Type 2 diabetes mellitus without complications: Secondary | ICD-10-CM | POA: Diagnosis not present

## 2017-06-16 NOTE — Progress Notes (Signed)
Subjective:     Patient ID: Luis Ross, male   DOB: May 03, 1950, 67 y.o.   MRN: [id number]  HPI Here for routine medical follow-up. Type 2 diabetes currently treated with invokana. Previous intolerance with metformin. Blood sugars have been very stable. He had recent labs with A1c 6.6%. He has family history of coronary disease in his mother. Previous intolerance with statins. Has been able to take Zetia 10 mg most days. Recent LDL cholesterol 62  No history of hepatitis C screening. He plans to get physical in May and will obtain at that time. No recent chest pains or other complaints. Inconsistent exercise over the past few months  Past Medical History:  Diagnosis Date  . HYPERLIPIDEMIA 02/26/2010   Past Surgical History:  Procedure Laterality Date  . GANGLION CYST EXCISION  1964   left wrist    reports that he quit smoking about 5 years ago. His smoking use included cigarettes. He has a 5.00 pack-year smoking history. he has never used smokeless tobacco. He reports that he does not drink alcohol or use drugs. family history includes Alcohol abuse in his father; Heart disease in his mother; Hypertension in his mother. Allergies  Allergen Reactions  . Crestor [Rosuvastatin Calcium]     myalgias  . Metformin And Related Diarrhea     Review of Systems  Constitutional: Negative for fatigue.  Eyes: Negative for visual disturbance.  Respiratory: Negative for cough, chest tightness and shortness of breath.   Cardiovascular: Negative for chest pain, palpitations and leg swelling.  Endocrine: Negative for polydipsia and polyuria.  Neurological: Negative for dizziness, syncope, weakness, light-headedness and headaches.       Objective:   Physical Exam  Constitutional: He is oriented to person, place, and time. He appears well-developed and well-nourished.  HENT:  Right Ear: External ear normal.  Left Ear: External ear normal.  Mouth/Throat: Oropharynx is clear and moist.  Eyes:  Pupils are equal, round, and reactive to light.  Neck: Neck supple. No thyromegaly present.  Cardiovascular: Normal rate and regular rhythm.  Pulmonary/Chest: Effort normal and breath sounds normal. No respiratory distress. He has no wheezes. He has no rales.  Musculoskeletal: He exhibits no edema.  Neurological: He is alert and oriented to person, place, and time.       Assessment:     Type 2 diabetes-controlled with recent A1c 6.6%    Plan:     Schedule complete physical for May Establish more consistent exercise habits Obtain hepatitis C antibody at follow-up labs  Kristian CoveyBruce W Sharnee Douglass MD [facility]

## 2017-08-09 ENCOUNTER — Other Ambulatory Visit: Payer: Self-pay | Admitting: Family Medicine

## 2017-08-09 NOTE — Telephone Encounter (Signed)
Refills OK.  He takes this for Rosacea.

## 2017-09-22 ENCOUNTER — Other Ambulatory Visit (INDEPENDENT_AMBULATORY_CARE_PROVIDER_SITE_OTHER): Payer: Managed Care, Other (non HMO)

## 2017-09-22 DIAGNOSIS — Z0000 Encounter for general adult medical examination without abnormal findings: Secondary | ICD-10-CM | POA: Diagnosis not present

## 2017-09-22 LAB — CBC WITH DIFFERENTIAL/PLATELET
BASOS ABS: 0 10*3/uL (ref 0.0–0.1)
BASOS PCT: 1 % (ref 0.0–3.0)
EOS ABS: 0.1 10*3/uL (ref 0.0–0.7)
Eosinophils Relative: 2.1 % (ref 0.0–5.0)
HEMATOCRIT: 48.6 % (ref 39.0–52.0)
Hemoglobin: 16.7 g/dL (ref 13.0–17.0)
LYMPHS PCT: 32 % (ref 12.0–46.0)
Lymphs Abs: 1.5 10*3/uL (ref 0.7–4.0)
MCHC: 34.4 g/dL (ref 30.0–36.0)
MCV: 93.2 fl (ref 78.0–100.0)
MONO ABS: 0.5 10*3/uL (ref 0.1–1.0)
Monocytes Relative: 10.7 % (ref 3.0–12.0)
Neutro Abs: 2.5 10*3/uL (ref 1.4–7.7)
Neutrophils Relative %: 54.2 % (ref 43.0–77.0)
Platelets: 202 10*3/uL (ref 150.0–400.0)
RBC: 5.21 Mil/uL (ref 4.22–5.81)
RDW: 12.7 % (ref 11.5–15.5)
WBC: 4.6 10*3/uL (ref 4.0–10.5)

## 2017-09-22 LAB — BASIC METABOLIC PANEL
BUN: 16 mg/dL (ref 6–23)
CO2: 27 mEq/L (ref 19–32)
CREATININE: 0.82 mg/dL (ref 0.40–1.50)
Calcium: 9.3 mg/dL (ref 8.4–10.5)
Chloride: 104 mEq/L (ref 96–112)
GFR: 100.18 mL/min (ref >60.00)
GLUCOSE: 142 mg/dL — AB (ref 70–99)
Potassium: 4.2 mEq/L (ref 3.5–5.1)
Sodium: 140 mEq/L (ref 135–145)

## 2017-09-22 LAB — LIPID PANEL
CHOL/HDL RATIO: 4
CHOLESTEROL: 142 mg/dL (ref 0–200)
HDL: 34.2 mg/dL — ABNORMAL LOW (ref >39.00)
LDL CALC: 87 mg/dL (ref 0–99)
NonHDL: 107.76
TRIGLYCERIDES: 106 mg/dL (ref 0.0–149.0)
VLDL: 21.2 mg/dL (ref 0.0–40.0)

## 2017-09-22 LAB — TSH: TSH: 1.11 u[IU]/mL (ref 0.35–4.50)

## 2017-09-22 LAB — HEPATIC FUNCTION PANEL
ALT: 27 U/L (ref 0–53)
AST: 17 U/L (ref 0–37)
Albumin: 4.4 g/dL (ref 3.5–5.2)
Alkaline Phosphatase: 46 U/L (ref 39–117)
BILIRUBIN DIRECT: 0.2 mg/dL (ref 0.0–0.3)
BILIRUBIN TOTAL: 1.1 mg/dL (ref 0.2–1.2)
TOTAL PROTEIN: 7 g/dL (ref 6.0–8.3)

## 2017-09-22 LAB — PSA: PSA: 0.29 ng/mL (ref 0.10–4.00)

## 2017-09-22 LAB — HEMOGLOBIN A1C: Hgb A1c MFr Bld: 6.7 % — ABNORMAL HIGH (ref 4.6–6.5)

## 2017-09-23 LAB — HEPATITIS C ANTIBODY
Hepatitis C Ab: NONREACTIVE
SIGNAL TO CUT-OFF: 0.02 (ref <1.00)

## 2017-09-24 ENCOUNTER — Encounter: Payer: Self-pay | Admitting: Family Medicine

## 2017-09-29 ENCOUNTER — Encounter: Payer: Self-pay | Admitting: Family Medicine

## 2017-09-29 ENCOUNTER — Other Ambulatory Visit: Payer: Self-pay | Admitting: Family Medicine

## 2017-09-29 ENCOUNTER — Ambulatory Visit (INDEPENDENT_AMBULATORY_CARE_PROVIDER_SITE_OTHER): Payer: Managed Care, Other (non HMO) | Admitting: Family Medicine

## 2017-09-29 VITALS — BP 120/70 | HR 92 | Temp 98.8°F | Ht 74.0 in | Wt 225.2 lb

## 2017-09-29 DIAGNOSIS — E785 Hyperlipidemia, unspecified: Secondary | ICD-10-CM | POA: Diagnosis not present

## 2017-09-29 DIAGNOSIS — E119 Type 2 diabetes mellitus without complications: Secondary | ICD-10-CM | POA: Diagnosis not present

## 2017-09-29 DIAGNOSIS — Z0000 Encounter for general adult medical examination without abnormal findings: Secondary | ICD-10-CM

## 2017-09-29 NOTE — Telephone Encounter (Signed)
Invokana and Zetia refills Last OV:09/29/17 Last refill: Invokana 04/29/16 and Zetia 07/26/16 (expired) MVH:QIONGEXBMPCP:[facility]: [facility] [place] - [address] [phone] (Phone) [phone] (Fax)

## 2017-09-29 NOTE — Telephone Encounter (Signed)
Copied from CRM [id number]. Topic: Quick Communication - Rx Refill/Question >> Sep 29, 2017  3:14 PM Eston Mouldavis, Michol Emory B wrote: Medication: ezetimibe (ZETIA) 10 MG tablet canagliflozin (INVOKANA) 300 MG TABS tablet   Has the patient contacted their pharmacy? no (Agent: If no, request that the patient contact the pharmacy for the refill.) (Agent: If yes, when and what did the pharmacy advise?)  Preferred Pharmacy (with phone number or street name): [facility] [place] - [address] [phone] (Phone) [phone] (Fax)      Agent: Please be advised that RX refills may take up to 3 business days. We ask that you follow-up with your pharmacy.

## 2017-09-29 NOTE — Patient Instructions (Signed)
Consider Prevnar 13 and Shingrix.

## 2017-09-29 NOTE — Progress Notes (Signed)
Subjective:     Patient ID: Luis Ross, male   DOB: 1951/03/23, 67 y.o.   MRN: [id number]  HPI Patient seen for physical exam. He has type 2 diabetes, history of rosacea, dyslipidemia. Diabetes has been relatively stable. Recent A1c 6.7%. He has been less physically active and has gained some weight since last visit.  Weight is up about 6 pounds from last February and up about 14 pounds from last August.  Hepatitis C screening was normal last year. Colonoscopy up-to-date. No history of shingles vaccine. He's had previous Pneumovax but needs Prevnar 13.  Reviewed with no changes:  Past Medical History:  Diagnosis Date  . HYPERLIPIDEMIA 02/26/2010   Past Surgical History:  Procedure Laterality Date  . GANGLION CYST EXCISION  1964   left wrist    reports that he quit smoking about 5 years ago. His smoking use included cigarettes. He has a 5.00 pack-year smoking history. He has never used smokeless tobacco. He reports that he does not drink alcohol or use drugs. family history includes Alcohol abuse in his father; Heart disease in his mother; Hypertension in his mother. Allergies  Allergen Reactions  . Crestor [Rosuvastatin Calcium]     myalgias  . Metformin And Related Diarrhea     Review of Systems  Constitutional: Negative for activity change, appetite change, fatigue and fever.  HENT: Negative for congestion, ear pain and trouble swallowing.   Eyes: Negative for pain and visual disturbance.  Respiratory: Negative for cough, shortness of breath and wheezing.   Cardiovascular: Negative for chest pain and palpitations.  Gastrointestinal: Negative for abdominal distention, abdominal pain, blood in stool, constipation, diarrhea, nausea, rectal pain and vomiting.  Genitourinary: Negative for dysuria, hematuria and testicular pain.  Musculoskeletal: Negative for arthralgias and joint swelling.  Skin: Negative for rash.  Neurological: Negative for dizziness, syncope and  headaches.  Hematological: Negative for adenopathy.  Psychiatric/Behavioral: Negative for confusion and dysphoric mood.       Objective:   Physical Exam  Constitutional: He is oriented to person, place, and time. He appears well-developed and well-nourished. No distress.  HENT:  Head: Normocephalic and atraumatic.  Right Ear: External ear normal.  Left Ear: External ear normal.  Mouth/Throat: Oropharynx is clear and moist.  Eyes: Pupils are equal, round, and reactive to light. Conjunctivae and EOM are normal.  Neck: Normal range of motion. Neck supple. No thyromegaly present.  Cardiovascular: Normal rate, regular rhythm and normal heart sounds.  No murmur heard. Pulmonary/Chest: No respiratory distress. He has no wheezes. He has no rales.  Abdominal: Soft. Bowel sounds are normal. He exhibits no distension and no mass. There is no tenderness. There is no rebound and no guarding.  Musculoskeletal: He exhibits no edema.  Lymphadenopathy:    He has no cervical adenopathy.  Neurological: He is alert and oriented to person, place, and time. He displays normal reflexes. No cranial nerve deficit.  Skin: No rash noted.  Feet reveal no skin lesions. Good distal foot pulses. Good capillary refill. No calluses. Normal sensation with monofilament testing   Psychiatric: He has a normal mood and affect.       Assessment:     Physical exam. The following issues were addressed    Plan:     -Needs Prevnar 13 and he plans to get this through his own medical office -Discuss new shingles vaccine and he will consider getting this -Recent labs reviewed with no major concerns -He is encouraged to lose some weight and establish  more consistent exercise -Continue with yearly eye exam -Scheduled routine follow-up in 3 months and sooner as needed  Luis CoveyBruce W Zinia Innocent MD [facility]

## 2017-10-02 MED ORDER — EZETIMIBE 10 MG PO TABS: 10.0000 mg | ORAL_TABLET | Freq: Every day | ORAL | 3 refills | Status: DC

## 2017-10-02 MED ORDER — CANAGLIFLOZIN 300 MG PO TABS: 300.0000 mg | ORAL_TABLET | Freq: Every day | ORAL | 3 refills | Status: DC

## 2018-01-04 ENCOUNTER — Other Ambulatory Visit (INDEPENDENT_AMBULATORY_CARE_PROVIDER_SITE_OTHER): Payer: Managed Care, Other (non HMO)

## 2018-01-04 ENCOUNTER — Encounter: Payer: Self-pay | Admitting: Family Medicine

## 2018-01-04 DIAGNOSIS — E785 Hyperlipidemia, unspecified: Secondary | ICD-10-CM

## 2018-01-04 DIAGNOSIS — E119 Type 2 diabetes mellitus without complications: Secondary | ICD-10-CM | POA: Diagnosis not present

## 2018-01-04 LAB — COMPREHENSIVE METABOLIC PANEL
ALT: 28 U/L (ref 0–53)
AST: 16 U/L (ref 0–37)
Albumin: 4.7 g/dL (ref 3.5–5.2)
Alkaline Phosphatase: 45 U/L (ref 39–117)
BUN: 21 mg/dL (ref 6–23)
CHLORIDE: 102 meq/L (ref 96–112)
CO2: 25 mEq/L (ref 19–32)
CREATININE: 0.95 mg/dL (ref 0.40–1.50)
Calcium: 9.6 mg/dL (ref 8.4–10.5)
GFR: 84.46 mL/min (ref >60.00)
GLUCOSE: 176 mg/dL — AB (ref 70–99)
Potassium: 4.5 mEq/L (ref 3.5–5.1)
SODIUM: 139 meq/L (ref 135–145)
Total Bilirubin: 0.8 mg/dL (ref 0.2–1.2)
Total Protein: 7.5 g/dL (ref 6.0–8.3)

## 2018-01-04 LAB — LIPID PANEL
CHOLESTEROL: 169 mg/dL (ref 0–200)
HDL: 35 mg/dL — ABNORMAL LOW (ref >39.00)
LDL Cholesterol: 105 mg/dL — ABNORMAL HIGH (ref 0–99)
NonHDL: 134.25
Total CHOL/HDL Ratio: 5
Triglycerides: 147 mg/dL (ref 0.0–149.0)
VLDL: 29.4 mg/dL (ref 0.0–40.0)

## 2018-01-04 LAB — HEMOGLOBIN A1C: Hgb A1c MFr Bld: 7.1 % — ABNORMAL HIGH (ref 4.6–6.5)

## 2018-01-12 ENCOUNTER — Ambulatory Visit: Payer: Managed Care, Other (non HMO) | Admitting: Family Medicine

## 2018-01-12 ENCOUNTER — Other Ambulatory Visit: Payer: Self-pay

## 2018-01-12 ENCOUNTER — Telehealth: Payer: Self-pay | Admitting: Family Medicine

## 2018-01-12 ENCOUNTER — Encounter: Payer: Self-pay | Admitting: Family Medicine

## 2018-01-12 ENCOUNTER — Telehealth: Payer: Self-pay | Admitting: *Deleted

## 2018-01-12 VITALS — BP 130/80 | HR 99 | Temp 98.4°F | Ht 74.0 in | Wt 224.6 lb

## 2018-01-12 DIAGNOSIS — F321 Major depressive disorder, single episode, moderate: Secondary | ICD-10-CM

## 2018-01-12 DIAGNOSIS — E119 Type 2 diabetes mellitus without complications: Secondary | ICD-10-CM | POA: Diagnosis not present

## 2018-01-12 DIAGNOSIS — E785 Hyperlipidemia, unspecified: Secondary | ICD-10-CM | POA: Diagnosis not present

## 2018-01-12 MED ORDER — VORTIOXETINE HBR 20 MG PO TABS: 20.0000 mg | ORAL_TABLET | Freq: Every day | ORAL | 0 refills | Status: DC

## 2018-01-12 NOTE — Progress Notes (Signed)
  Subjective:     Patient ID: Luis Ross, male   DOB: 1950-09-22, 67 y.o.   MRN: [id number]  HPI Patient seen for follow-up regarding type 2 diabetes. Decreased compliance with diet and exercise since last visit. Remains on Invokana for his diabetes.he has dyslipidemia and recent lipids were stable. He remains on Zetia. Previous intolerance with statins.  Major issue today is increased depressed mood building over several months. He's had difficulty sleeping, depressed mood, difficulty concentrating, loss of interest in activities. No suicidal ideation. Had previous bouts several years ago treated with SSRI with some improvement. He does recall prior history of nausea with SSRIs including sertraline and Lexapro.  Past Medical History:  Diagnosis Date  . HYPERLIPIDEMIA 02/26/2010   Past Surgical History:  Procedure Laterality Date  . GANGLION CYST EXCISION  1964   left wrist    reports that he quit smoking about 6 years ago. His smoking use included cigarettes. He has a 5.00 pack-year smoking history. He has never used smokeless tobacco. He reports that he does not drink alcohol or use drugs. family history includes Alcohol abuse in his father; Heart disease in his mother; Hypertension in his mother. Allergies  Allergen Reactions  . Crestor [Rosuvastatin Calcium]     myalgias  . Metformin And Related Diarrhea     Review of Systems  Respiratory: Negative for shortness of breath.   Cardiovascular: Negative for chest pain.  Endocrine: Negative for polydipsia and polyuria.  Psychiatric/Behavioral: Positive for dysphoric mood and sleep disturbance. Negative for agitation, self-injury and suicidal ideas.       Objective:   Physical Exam  Constitutional: He appears well-developed and well-nourished.  Cardiovascular: Normal rate and regular rhythm.  Pulmonary/Chest: Breath sounds normal. No stridor. No respiratory distress.  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  [name] MD [facility] at [facility]

## 2018-01-12 NOTE — Telephone Encounter (Signed)
Copied from CRM [phone]. Topic: Quick Communication - See Telephone Encounter >> Jan 12, 2018  3:26 PM Jens SomMedley, Jennifer A wrote: CRM for notification. See Telephone encounter for: 01/12/18. Patient is requesting a Prior Auth for vortioxetine HBr (TRINTELLIX) 20 MG TABS tablet [[id number]][[id number]]  at [id number] [place]- [place], [place] - [address] [place] [id number] Phone: [phone] Fax: [phone]

## 2018-01-12 NOTE — Patient Instructions (Signed)
Give me some feedback in 3-4 weeks regarding the Trintellix

## 2018-01-12 NOTE — Telephone Encounter (Signed)
Prior auth for Trintellix 20mg  sent as an urgent request (as the pt has tried Zoloft, Lexapro, and Celexa-discontinued due to nausea) per Dr Caryl NeverBurchette to [email or web address]-key [id number].

## 2018-01-12 NOTE — Telephone Encounter (Addendum)
Wrong office, sending to the right one.   Dr.[facility] please disregard. Tried to send to [facility]

## 2018-01-12 NOTE — Telephone Encounter (Signed)
Note in [email or web address][employer] stated the request received a favorable outcome.  Note states: Approvedtoday  Request Reference Number: [id number]. TRINTELLIX TAB 20MG  is approved through 01/13/2019. For further questions, call [phone].  [facility] and informed Misty StanleyLisa of this.

## 2018-01-15 NOTE — Telephone Encounter (Signed)
PA for Trintellix has been approved. See note in chart.

## 2018-02-07 ENCOUNTER — Other Ambulatory Visit: Payer: Self-pay | Admitting: Family Medicine

## 2018-02-07 MED ORDER — VORTIOXETINE HBR 20 MG PO TABS: 20.0000 mg | ORAL_TABLET | Freq: Every day | ORAL | 0 refills | Status: DC

## 2018-02-07 NOTE — Addendum Note (Signed)
Addended by: Wilford Corner on: 02/07/2018 10:07 AM   Modules accepted: Orders

## 2018-02-07 NOTE — Telephone Encounter (Signed)
Pt called stating this medication is supposed to be sent to different pharmacy. Pt is requesting it be sent to  [facility] [place], [place] - [address] [phone] (Phone) [phone] (Fax)

## 2018-02-08 ENCOUNTER — Telehealth: Payer: Self-pay | Admitting: *Deleted

## 2018-02-08 NOTE — Telephone Encounter (Signed)
Copied from CRM [id number]. Topic: Quick Communication - See Telephone Encounter >> Feb 08, 2018 12:12 PM Waymon Amato wrote: Pt states that he called in his vortioxetine and it was sent to pharmacy on 02/07/18 and today the patient received a call from the pharmacy [facility] that it was not available to be filled until 02/27/18 Pt states that he will be out by then and that he had requested a 90 day supply  Best number [phone]

## 2018-02-11 NOTE — Telephone Encounter (Signed)
Refill #90 with one additional refill 

## 2018-02-13 MED ORDER — VORTIOXETINE HBR 20 MG PO TABS: 20.0000 mg | ORAL_TABLET | Freq: Every day | ORAL | 1 refills | Status: DC

## 2018-03-04 ENCOUNTER — Other Ambulatory Visit: Payer: Self-pay | Admitting: Family Medicine

## 2018-04-12 ENCOUNTER — Other Ambulatory Visit (INDEPENDENT_AMBULATORY_CARE_PROVIDER_SITE_OTHER): Payer: Managed Care, Other (non HMO)

## 2018-04-12 ENCOUNTER — Encounter: Payer: Self-pay | Admitting: Family Medicine

## 2018-04-12 DIAGNOSIS — E119 Type 2 diabetes mellitus without complications: Secondary | ICD-10-CM

## 2018-04-12 DIAGNOSIS — E785 Hyperlipidemia, unspecified: Secondary | ICD-10-CM

## 2018-04-12 LAB — LIPID PANEL
CHOLESTEROL: 155 mg/dL (ref 0–200)
HDL: 35.2 mg/dL — AB (ref >39.00)
LDL CALC: 91 mg/dL (ref 0–99)
NonHDL: 119.34
Total CHOL/HDL Ratio: 4
Triglycerides: 141 mg/dL (ref 0.0–149.0)
VLDL: 28.2 mg/dL (ref 0.0–40.0)

## 2018-04-12 LAB — COMPREHENSIVE METABOLIC PANEL
ALBUMIN: 4.5 g/dL (ref 3.5–5.2)
ALK PHOS: 48 U/L (ref 39–117)
ALT: 33 U/L (ref 0–53)
AST: 20 U/L (ref 0–37)
BILIRUBIN TOTAL: 0.6 mg/dL (ref 0.2–1.2)
BUN: 16 mg/dL (ref 6–23)
CALCIUM: 9 mg/dL (ref 8.4–10.5)
CO2: 26 mEq/L (ref 19–32)
Chloride: 104 mEq/L (ref 96–112)
Creatinine, Ser: 0.83 mg/dL (ref 0.40–1.50)
GFR: 98.62 mL/min (ref >60.00)
Glucose, Bld: 186 mg/dL — ABNORMAL HIGH (ref 70–99)
POTASSIUM: 4.3 meq/L (ref 3.5–5.1)
Sodium: 140 mEq/L (ref 135–145)
Total Protein: 7.1 g/dL (ref 6.0–8.3)

## 2018-04-12 LAB — HEMOGLOBIN A1C: HEMOGLOBIN A1C: 7.3 % — AB (ref 4.6–6.5)

## 2018-04-13 ENCOUNTER — Telehealth: Payer: Self-pay | Admitting: Family Medicine

## 2018-04-13 ENCOUNTER — Encounter: Payer: Self-pay | Admitting: Family Medicine

## 2018-04-13 ENCOUNTER — Ambulatory Visit: Payer: Managed Care, Other (non HMO) | Admitting: Family Medicine

## 2018-04-13 ENCOUNTER — Other Ambulatory Visit: Payer: Self-pay

## 2018-04-13 VITALS — BP 136/78 | HR 99 | Temp 98.0°F | Ht 74.0 in | Wt 227.5 lb

## 2018-04-13 DIAGNOSIS — E119 Type 2 diabetes mellitus without complications: Secondary | ICD-10-CM | POA: Diagnosis not present

## 2018-04-13 DIAGNOSIS — R5383 Other fatigue: Secondary | ICD-10-CM

## 2018-04-13 DIAGNOSIS — E785 Hyperlipidemia, unspecified: Secondary | ICD-10-CM | POA: Diagnosis not present

## 2018-04-13 MED ORDER — BUPROPION HCL ER (SR) 100 MG PO TB12: 100.0000 mg | ORAL_TABLET | Freq: Every day | ORAL | 1 refills | Status: DC

## 2018-04-13 MED ORDER — VORTIOXETINE HBR 20 MG PO TABS: 20.0000 mg | ORAL_TABLET | Freq: Every day | ORAL | 3 refills | Status: DC

## 2018-04-13 NOTE — Telephone Encounter (Signed)
Copied from CRM [id number]. Topic: Quick Communication - See Telephone Encounter >> Apr 13, 2018  2:18 PM Lorrine KinMcGee, Veldon Wager B, NT wrote: CRM for notification. See Telephone encounter for: 04/13/18. Santina Evansatherine with [facility] calling and states that the vortioxetine HBr (TRINTELLIX) 20 MG TABS tablet    and     buPROPion (WELLBUTRIN SR) 100 MG 12 hr tablet, they cannot fill there at that pharmacy. It is not a specialty medication. States taht this could be sent to any [facility] that the patient requests. Please advise.

## 2018-04-13 NOTE — Progress Notes (Signed)
  Subjective:     Patient ID: Delaney MeigsLeonard R Rolf, male   DOB: 11/07/1950, 67 y.o.   MRN: [id number]  HPI Patient is seen for medical follow-up.  He has type 2 diabetes and had recent follow-up labs.  Lipids were stable.  A1c 7.3%.  He has not been exercising much.  Has had increased fatigue recently.  He is not sure how much of this may be related to depression symptoms.  We recently started Trintellix and he feels that his depression has improved but he has not seen as much improvement in energy and initiative.  He does feel more focused since starting the Trintellix.  His current medications include Invokana, Trintellix, Zetia, losartan.  Previous intolerance with statins.  Previous intolerance with metformin.  Increased work stress.  He has made a firm decision to retire after April  Past Medical History:  Diagnosis Date  . HYPERLIPIDEMIA 02/26/2010   Past Surgical History:  Procedure Laterality Date  . GANGLION CYST EXCISION  1964   left wrist    reports that he quit smoking about 6 years ago. His smoking use included cigarettes. He has a 5.00 pack-year smoking history. He has never used smokeless tobacco. He reports that he does not drink alcohol or use drugs. family history includes Alcohol abuse in his father; Heart disease in his mother; Hypertension in his mother. Allergies  Allergen Reactions  . Crestor [Rosuvastatin Calcium]     myalgias  . Metformin And Related Diarrhea     Review of Systems  Constitutional: Positive for fatigue.  Respiratory: Negative for shortness of breath.   Cardiovascular: Negative for chest pain.  Neurological: Negative for weakness.  Psychiatric/Behavioral: Negative for agitation and sleep disturbance.       Objective:   Physical Exam Constitutional:      Appearance: Normal appearance.  Cardiovascular:     Rate and Rhythm: Normal rate and regular rhythm.  Pulmonary:     Effort: Pulmonary effort is normal.     Breath sounds: Normal breath  sounds.  Neurological:     Mental Status: He is alert.  Psychiatric:        Mood and Affect: Mood normal.        Thought Content: Thought content normal.        Assessment:     #1 major depressive episode improved with Trintellix.  Patient still has some increased fatigue and low initiative  #2 fatigue questionably related to #1.  No history of any observed sleep apnea.  Recent TSH in May normal  #3 type 2 diabetes with recent A1c 7.3%  #4 dyslipidemia.  On Zetia and intolerant of statins.    Plan:     -Consider activating antidepressant such as Wellbutrin low-dose.  We have agreed to trial of Wellbutrin SR 100 mg each morning and he will continue Trintellix at night  -Consider other evaluations including possible testosterone level and possible sleep studies if fatigue not improving with the above.  -He is encouraged to engage in more consistent exercise and hopefully will feel more up to this after starting Wellbutrin.  -We discussed diabetes management.  He is not interested in additional medications but would like to try a 1648-month trial of weight loss and getting into his exercise again before looking at additional medications  [name] MD  [facility]

## 2018-04-13 NOTE — Telephone Encounter (Signed)
Pt would like medication sent to  [id number]- [place], [place] - [address] [phone] (Phone) [phone] (Fax)

## 2018-04-13 NOTE — Telephone Encounter (Signed)
Both [facility] [address]

## 2018-04-13 NOTE — Telephone Encounter (Signed)
Called patient and LMOVM to return call  [facility] / Schedule patient  Called patient and gave him the message that the [facility] is not able to fill his Bupropion or Trintellix and they stated that [facility] or [facility]. Asked patient to please call us back to let us know if he wants [facility] [address] or a different pharmacy.  CRM Created.

## 2018-05-21 ENCOUNTER — Other Ambulatory Visit: Payer: Self-pay | Admitting: Family Medicine

## 2018-05-21 MED ORDER — CANAGLIFLOZIN 300 MG PO TABS: 300.0000 mg | ORAL_TABLET | Freq: Every day | ORAL | 1 refills | Status: DC

## 2018-05-21 NOTE — Telephone Encounter (Signed)
Copied from CRM [id number]. Topic: Quick Communication - Rx Refill/Question >> May 21, 2018  9:02 AM Baldo Daub L wrote: Medication: canagliflozin (INVOKANA) 300 MG TABS tablet  Has the patient contacted their pharmacy? Yes - states he needs to switch pharmacies.  Pt states this is going from mail order to local pharmacy - local pharmacy states that they can not transfer. (Agent: If no, request that the patient contact the pharmacy for the refill.) (Agent: If yes, when and what did the pharmacy advise?)  Preferred Pharmacy (with phone number or street name): [facility] [place], [place] - [address] [phone] (Phone) [phone] (Fax)  Agent: Please be advised that RX refills may take up to 3 business days. We ask that you follow-up with your pharmacy.

## 2018-06-15 ENCOUNTER — Other Ambulatory Visit: Payer: Self-pay | Admitting: Family Medicine

## 2018-06-15 MED ORDER — CANAGLIFLOZIN 300 MG PO TABS: 300.0000 mg | ORAL_TABLET | Freq: Every day | ORAL | 1 refills | Status: DC

## 2018-06-15 MED ORDER — BUPROPION HCL ER (SR) 100 MG PO TB12: 100.0000 mg | ORAL_TABLET | Freq: Every day | ORAL | 1 refills | Status: DC

## 2018-06-15 MED ORDER — VORTIOXETINE HBR 20 MG PO TABS: 20.0000 mg | ORAL_TABLET | Freq: Every day | ORAL | 2 refills | Status: DC

## 2018-06-15 MED ORDER — EZETIMIBE 10 MG PO TABS: 10.0000 mg | ORAL_TABLET | Freq: Every day | ORAL | 1 refills | Status: DC

## 2018-06-15 NOTE — Telephone Encounter (Signed)
Copied from CRM [id number]. Topic: Quick Communication - Rx Refill/Question >> Jun 15, 2018 11:37 AM Fanny Bien wrote: Medication:vortioxetine HBr (TRINTELLIX) 20 MG TABS tablet [[id number]]  90 day refill   Has the patient contacted their pharmacy? yes Preferred Pharmacy (with phone number or street name):[facility] [place], [place] - [address] [phone] (Phone) [phone] (Fax)   Agent: Please be advised that RX refills may take up to 3 business days. We ask that you follow-up with your pharmacy.

## 2018-06-15 NOTE — Telephone Encounter (Signed)
Requested medication (s) are due for refill today: Yes  Requested medication (s) are on the active medication list: yes  Last refill:  08/09/17  Future visit scheduled: Yes  Notes to clinic:  See request    Requested Prescriptions  Pending Prescriptions Disp Refills   doxycycline (VIBRAMYCIN) 100 MG capsule 180 capsule 3     Off-Protocol Failed - 06/15/2018 12:22 PM      Failed - Medication not assigned to a protocol, review manually.      Passed - Valid encounter within last 12 months    Recent Outpatient Visits          2 months ago Controlled type 2 diabetes mellitus without complication, without long-term current use of insulin ([place])   [facility], Alinda Sierras, MD   5 months ago Controlled type 2 diabetes mellitus without complication, without long-term current use of insulin ([place])   [facility] at [facility], Alinda Sierras, MD   8 months ago Physical exam   Therapist, [facility] at [facility], Alinda Sierras, MD   12 months ago Controlled type 2 diabetes mellitus without complication, without long-term current use of insulin ([place])   Therapist, [facility] at [facility], Alinda Sierras, MD   1 year ago Hyperlipidemia associated with type 2 diabetes mellitus ([place])   Therapist, [facility] at [facility], Alinda Sierras, MD      Future Appointments            In 1 month Burchette, Alinda Sierras, MD [place] at [place], [facility]         Signed Prescriptions Disp Refills   buPROPion (WELLBUTRIN SR) 100 MG 12 hr tablet 90 tablet 1    Sig: Take 1 tablet (100 mg total) by mouth daily.     Psychiatry: Antidepressants - bupropion Passed - 06/15/2018 12:22 PM      Passed - Last BP in normal range    BP Readings from Last 1 Encounters:  04/13/18 136/78         Passed - Valid encounter within last 6 months    Recent Outpatient Visits          2 months ago Controlled type 2 diabetes mellitus without complication, without long-term  current use of insulin ([place])   Therapist, music at [facility], Alinda Sierras, MD   5 months ago Controlled type 2 diabetes mellitus without complication, without long-term current use of insulin ([place])   Therapist, music at [facility], Alinda Sierras, MD   8 months ago Physical exam   Therapist, music at [facility], Alinda Sierras, MD   12 months ago Controlled type 2 diabetes mellitus without complication, without long-term current use of insulin ([place])   Therapist, music at [facility], Alinda Sierras, MD   1 year ago Hyperlipidemia associated with type 2 diabetes mellitus ([place])   Therapist, music at [facility], Alinda Sierras, MD      Future Appointments            In 1 month Burchette, Alinda Sierras, MD [place] at [place], [facility]          canagliflozin (INVOKANA) 300 MG TABS tablet 90 tablet 1    Sig: Take 1 tablet (300 mg total) by mouth daily.     Endocrinology: Diabetes - SGLT2 Inhibitors - canagliflozin Passed - 06/15/2018 12:22 PM      Passed - HBA1C is between 0 and 7.9 and within 180 days    Hgb A1c MFr Bld  Date  Value Ref Range Status  04/12/2018 7.3 (H) 4.6 - 6.5 % Final    Comment:    Glycemic Control Guidelines for People with Diabetes:Non Diabetic:  <6%Goal of Therapy: <7%Additional Action Suggested:  >8%          Passed - Cr in normal range and within 360 days    Creatinine, Ser  Date Value Ref Range Status  04/12/2018 0.83 0.40 - 1.50 mg/dL Final         Passed - LDL in normal range and within 360 days    LDL Cholesterol  Date Value Ref Range Status  04/12/2018 91 0 - 99 mg/dL Final         Passed - eGFR in normal range and within 360 days    GFR  Date Value Ref Range Status  04/12/2018 98.62 >60.00 mL/min Final         Passed - Valid encounter within last 6 months    Recent Outpatient Visits          2 months ago Controlled type 2 diabetes mellitus without complication, without long-term current use of insulin ([place])    [place] at [facility], Alinda Sierras, MD   5 months ago Controlled type 2 diabetes mellitus without complication, without long-term current use of insulin ([place])   Therapist, music at [facility], Alinda Sierras, MD   8 months ago Physical exam   Therapist, music at [facility], Alinda Sierras, MD   12 months ago Controlled type 2 diabetes mellitus without complication, without long-term current use of insulin ([place])   Therapist, music at [facility], Alinda Sierras, MD   1 year ago Hyperlipidemia associated with type 2 diabetes mellitus ([place])   Therapist, music at [facility], Alinda Sierras, MD      Future Appointments            In 1 month Burchette, Alinda Sierras, MD [place] at [place], [place]          ezetimibe (ZETIA) 10 MG tablet 90 tablet 1    Sig: Take 1 tablet (10 mg total) by mouth daily.     Cardiovascular:  Antilipid - Sterol Transport Inhibitors Failed - 06/15/2018 12:22 PM      Failed - HDL in normal range and within 360 days    HDL  Date Value Ref Range Status  04/12/2018 35.20 (L) >39.00 mg/dL Final         Passed - Total Cholesterol in normal range and within 360 days    Cholesterol  Date Value Ref Range Status  04/12/2018 155 0 - 200 mg/dL Final    Comment:    ATP III Classification       Desirable:  < 200 mg/dL               Borderline High:  200 - 239 mg/dL          High:  > = 240 mg/dL         Passed - LDL in normal range and within 360 days    LDL Cholesterol  Date Value Ref Range Status  04/12/2018 91 0 - 99 mg/dL Final         Passed - Triglycerides in normal range and within 360 days    Triglycerides  Date Value Ref Range Status  04/12/2018 141.0 0.0 - 149.0 mg/dL Final    Comment:    Normal:  <150 mg/dLBorderline High:  150 - 199 mg/dL  Passed - Valid encounter within last 12 months    Recent Outpatient Visits          2 months ago Controlled type 2 diabetes mellitus without complication, without  long-term current use of insulin ([place])   [place] at [facility], Alinda Sierras, MD   5 months ago Controlled type 2 diabetes mellitus without complication, without long-term current use of insulin ([place])   Therapist, music at [facility], Alinda Sierras, MD   8 months ago Physical exam   Therapist, music at [facility], Alinda Sierras, MD   12 months ago Controlled type 2 diabetes mellitus without complication, without long-term current use of insulin ([place])   Therapist, music at [facility], Alinda Sierras, MD   1 year ago Hyperlipidemia associated with type 2 diabetes mellitus ([place])   Therapist, music at [facility], Alinda Sierras, MD      Future Appointments            In 1 month Burchette, Alinda Sierras, MD [place] at [place], [facility]

## 2018-06-15 NOTE — Telephone Encounter (Signed)
Remaining refills sent to [facility] on [address]

## 2018-06-15 NOTE — Telephone Encounter (Signed)
Copied from CRM [id number]. Topic: Quick Communication - Rx Refill/Question >> Jun 15, 2018 11:39 AM Fanny Bien wrote: Medication: buPROPion [facility] SR) 100 MG 12 hr tablet [[id number]] canagliflozin (INVOKANA) 300 MG TABS tablet [[id number]] doxycycline (VIBRAMYCIN) 100 MG capsule [[id number]] ezetimibe (ZETIA) 10 MG tablet [[id number]]  Needs 90 day refill on all medications   Has the patient contacted their pharmacy?no Preferred Pharmacy (with phone number or street name):[facility] - [place], [place] - [address] [phone] (Phone) [phone] (Fax)   Agent: Please be advised that RX refills may take up to 3 business days. We ask that you follow-up with your pharmacy.

## 2018-06-18 NOTE — Telephone Encounter (Signed)
Refill once 

## 2018-06-18 NOTE — Telephone Encounter (Signed)
OK to fill Doxy?  Last OV 04/13/18, Next OV 07/20/18

## 2018-06-19 MED ORDER — DOXYCYCLINE HYCLATE 100 MG PO CAPS: ORAL_CAPSULE | ORAL | 0 refills | Status: DC

## 2018-07-19 ENCOUNTER — Other Ambulatory Visit: Payer: Self-pay

## 2018-07-20 ENCOUNTER — Ambulatory Visit: Payer: Self-pay | Admitting: Family Medicine

## 2018-08-07 ENCOUNTER — Other Ambulatory Visit: Payer: Self-pay | Admitting: Family Medicine

## 2018-08-07 NOTE — Telephone Encounter (Signed)
Continuous. Takes for rosacea.  Refill with 3 additional refills.

## 2018-08-07 NOTE — Telephone Encounter (Signed)
Is this a continuous medication? Refills OK and how many? Please advise.

## 2018-09-25 ENCOUNTER — Telehealth: Payer: Self-pay | Admitting: *Deleted

## 2018-09-25 MED ORDER — ESCITALOPRAM OXALATE 10 MG PO TABS: 10.0000 mg | ORAL_TABLET | Freq: Every day | ORAL | 3 refills | Status: DC

## 2018-09-25 NOTE — Telephone Encounter (Signed)
Pt called requesting change from Trintellix to Lexapro because of cost.   Depression symptoms are stable.  We will d/c trintellix and start Lexapro 10 mg po qd.

## 2018-09-25 NOTE — Telephone Encounter (Signed)
Copied from CRM [id number]. Topic: Quick Communication - Rx Refill/Question >> Sep 25, 2018  9:04 AM Randol Kern wrote: Medication: vortioxetine HBr (TRINTELLIX) 20 MG TABS tablet [[id number]] -Pt called requesting to change from this prescription to Lexipro 10 mg 1 a day because it is more affordable. Please advise.  Has the patient contacted their pharmacy? No (Agent: If no, request that the patient contact the pharmacy for the refill.) (Agent: If yes, when and what did the pharmacy advise?)  Preferred Pharmacy (with phone number or street name): [facility] [place], [place] - [address] [place] [address] Phone: [phone] Fax: [phone]    Agent: Please be advised that RX refills may take up to 3 business days. We ask that you follow-up with your pharmacy.

## 2018-09-29 ENCOUNTER — Other Ambulatory Visit: Payer: Self-pay | Admitting: Family Medicine

## 2018-11-17 ENCOUNTER — Other Ambulatory Visit: Payer: Self-pay | Admitting: Family Medicine

## 2018-11-24 ENCOUNTER — Other Ambulatory Visit: Payer: Self-pay | Admitting: Family Medicine

## 2019-02-14 ENCOUNTER — Other Ambulatory Visit: Payer: Self-pay | Admitting: Family Medicine

## 2019-02-26 ENCOUNTER — Other Ambulatory Visit: Payer: Self-pay

## 2019-02-26 ENCOUNTER — Encounter: Payer: Self-pay | Admitting: Family Medicine

## 2019-02-26 ENCOUNTER — Other Ambulatory Visit (INDEPENDENT_AMBULATORY_CARE_PROVIDER_SITE_OTHER): Payer: Medicare Other

## 2019-02-26 ENCOUNTER — Telehealth (INDEPENDENT_AMBULATORY_CARE_PROVIDER_SITE_OTHER): Payer: Medicare Other | Admitting: Family Medicine

## 2019-02-26 DIAGNOSIS — R5383 Other fatigue: Secondary | ICD-10-CM

## 2019-02-26 DIAGNOSIS — G4452 New daily persistent headache (NDPH): Secondary | ICD-10-CM | POA: Diagnosis not present

## 2019-02-26 DIAGNOSIS — R519 Headache, unspecified: Secondary | ICD-10-CM | POA: Diagnosis not present

## 2019-02-26 DIAGNOSIS — E785 Hyperlipidemia, unspecified: Secondary | ICD-10-CM

## 2019-02-26 DIAGNOSIS — E119 Type 2 diabetes mellitus without complications: Secondary | ICD-10-CM | POA: Diagnosis not present

## 2019-02-26 LAB — LIPID PANEL
Cholesterol: 197 mg/dL (ref 0–200)
HDL: 34.2 mg/dL — ABNORMAL LOW (ref >39.00)
Total CHOL/HDL Ratio: 6
Triglycerides: 463 mg/dL — ABNORMAL HIGH (ref 0.0–149.0)

## 2019-02-26 LAB — COMPREHENSIVE METABOLIC PANEL
ALT: 35 U/L (ref 0–53)
AST: 20 U/L (ref 0–37)
Albumin: 4.6 g/dL (ref 3.5–5.2)
Alkaline Phosphatase: 55 U/L (ref 39–117)
BUN: 19 mg/dL (ref 6–23)
CO2: 26 mEq/L (ref 19–32)
Calcium: 9.2 mg/dL (ref 8.4–10.5)
Chloride: 103 mEq/L (ref 96–112)
Creatinine, Ser: 0.78 mg/dL (ref 0.40–1.50)
GFR: 99.42 mL/min (ref >60.00)
Glucose, Bld: 268 mg/dL — ABNORMAL HIGH (ref 70–99)
Potassium: 4 mEq/L (ref 3.5–5.1)
Sodium: 139 mEq/L (ref 135–145)
Total Bilirubin: 0.7 mg/dL (ref 0.2–1.2)
Total Protein: 7 g/dL (ref 6.0–8.3)

## 2019-02-26 LAB — LDL CHOLESTEROL, DIRECT: Direct LDL: 125 mg/dL

## 2019-02-26 LAB — PSA: PSA: 0.18 ng/mL (ref 0.10–4.00)

## 2019-02-26 LAB — TSH: TSH: 1.27 u[IU]/mL (ref 0.35–4.50)

## 2019-02-26 NOTE — Progress Notes (Signed)
Virtual Visit via Video Note  I connected with the patient on 02/26/19 at 11:30 AM EST by a video enabled telemedicine application and verified that I am speaking with the correct person using two identifiers.  Location patient: home Location provider:work or home office Persons participating in the virtual visit: patient, provider  I discussed the limitations of evaluation and management by telemedicine and the availability of in person appointments. The patient expressed understanding and agreed to proceed.   HPI: Here for a headache that started about 3 weeks ago. This is located in the left frontoparietal area of the head. The pain is dull and constant, though it gets worse during the day and better at night. No eye pain. No vision changes. No other neurologic deficits. He has a hx of migraines, but he has never felt this type of headache before. No recent trauma. He is taking Ibuprofen and Tylenol.    ROS: See pertinent positives and negatives per HPI.  Past Medical History:  Diagnosis Date  . HYPERLIPIDEMIA 02/26/2010    Past Surgical History:  Procedure Laterality Date  . GANGLION CYST EXCISION  1964   left wrist    Family History  Problem Relation Age of Onset  . Hypertension Mother   . Heart disease Mother   . Alcohol abuse Father      Current Outpatient Medications:  .  ezetimibe (ZETIA) 10 MG tablet, TAKE 1 TABLET BY MOUTH  DAILY, Disp: 90 tablet, Rfl: 1 .  INVOKANA 300 MG TABS tablet, TAKE 1 TABLET BY MOUTH  DAILY, Disp: 90 tablet, Rfl: 0  EXAM:  VITALS per patient if applicable:  GENERAL: alert, oriented, appears well and in no acute distress  HEENT: atraumatic, conjunttiva clear, no obvious abnormalities on inspection of external nose and ears  NECK: normal movements of the head and neck  LUNGS: on inspection no signs of respiratory distress, breathing rate appears normal, no obvious gross SOB, gasping or wheezing  CV: no obvious cyanosis  MS: moves  all visible extremities without noticeable abnormality  PSYCH/NEURO: pleasant and cooperative, no obvious depression or anxiety, speech and thought processing grossly intact  ASSESSMENT AND PLAN: New onset of a constant headache. We will set up a contrasted head CT soon to rule out space occupying lesions. He will use Ibuprofen as needed. He will follow up with his PCP, Dr. Elease Hashimoto, on 03-08-19.  Alysia Penna, MD  Discussed the following assessment and plan:  Acute intractable headache, unspecified headache type - Plan: Basic metabolic panel  New daily persistent headache - Plan: CT HEAD W & WO CONTRAST     I discussed the assessment and treatment plan with the patient. The patient was provided an opportunity to ask questions and all were answered. The patient agreed with the plan and demonstrated an understanding of the instructions.   The patient was advised to call back or seek an in-person evaluation if the symptoms worsen or if the condition fails to improve as anticipated.

## 2019-02-26 NOTE — Addendum Note (Signed)
Addended by: Suzette Battiest on: 02/26/2019 02:52 PM   Modules accepted: Orders

## 2019-02-27 ENCOUNTER — Telehealth: Payer: Self-pay | Admitting: Family Medicine

## 2019-02-27 LAB — HEMOGLOBIN A1C: Hgb A1c MFr Bld: 8.8 % — ABNORMAL HIGH (ref 4.6–6.5)

## 2019-02-27 NOTE — Telephone Encounter (Signed)
Sent to [facility] to schedule

## 2019-02-28 ENCOUNTER — Ambulatory Visit
Admission: RE | Admit: 2019-02-28 | Discharge: 2019-02-28 | Disposition: A | Discharge status: Home / Self Care | Payer: Medicare Other | Source: Ambulatory Visit | Attending: Family Medicine | Admitting: Family Medicine

## 2019-02-28 DIAGNOSIS — G4452 New daily persistent headache (NDPH): Secondary | ICD-10-CM

## 2019-02-28 MED ORDER — IOPAMIDOL (ISOVUE-300) INJECTION 61%
75.0000 mL | Freq: Once | INTRAVENOUS | Status: AC | PRN
  Administered 2019-02-28: 75 mL via INTRAVENOUS

## 2019-03-02 ENCOUNTER — Encounter: Payer: Self-pay | Admitting: Family Medicine

## 2019-03-04 ENCOUNTER — Other Ambulatory Visit: Payer: Self-pay | Admitting: Family Medicine

## 2019-03-04 NOTE — Telephone Encounter (Signed)
See rx refill request 

## 2019-03-07 ENCOUNTER — Other Ambulatory Visit: Payer: Self-pay

## 2019-03-08 ENCOUNTER — Encounter: Payer: Self-pay | Admitting: Family Medicine

## 2019-03-08 ENCOUNTER — Ambulatory Visit (INDEPENDENT_AMBULATORY_CARE_PROVIDER_SITE_OTHER): Payer: Medicare Other | Admitting: Family Medicine

## 2019-03-08 ENCOUNTER — Telehealth: Payer: Self-pay | Admitting: Family Medicine

## 2019-03-08 ENCOUNTER — Other Ambulatory Visit: Payer: Self-pay

## 2019-03-08 VITALS — BP 112/62 | HR 98 | Temp 98.8°F | Resp 16 | Ht 74.0 in | Wt 227.6 lb

## 2019-03-08 DIAGNOSIS — R251 Tremor, unspecified: Secondary | ICD-10-CM

## 2019-03-08 DIAGNOSIS — I10 Essential (primary) hypertension: Secondary | ICD-10-CM

## 2019-03-08 DIAGNOSIS — E119 Type 2 diabetes mellitus without complications: Secondary | ICD-10-CM

## 2019-03-08 MED ORDER — RYBELSUS 7 MG PO TABS: 7.0000 mg | ORAL_TABLET | Freq: Every day | ORAL | 3 refills | Status: DC

## 2019-03-08 MED ORDER — RYBELSUS 3 MG PO TABS: 3.0000 mg | ORAL_TABLET | Freq: Every day | ORAL | 0 refills | Status: DC

## 2019-03-08 MED ORDER — LOSARTAN POTASSIUM 100 MG PO TABS: 100.0000 mg | ORAL_TABLET | Freq: Every day | ORAL | 11 refills | Status: DC

## 2019-03-08 NOTE — Progress Notes (Signed)
Subjective:     Patient ID: Luis Ross, male   DOB: Feb 02, 1951, 69 y.o.   MRN: [id number]  HPI   Luis Ross is seen for routine medical follow-up.  He had virtual visit recently for unilateral headache.  CT head showed no acute findings.  Headaches have improved slightly since then.  He had tapered off losartan back in March but recent blood pressures have been increasing as high as 160/90.  He is now back on losartan 100 mg daily.  Blood pressures are improving.  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  That son-in-law has Lynch syndrome.  Recent A1c 8.8%.  He states he started walking again regularly and has lost 10 pounds.  Previous intolerance of Metformin.  He has been taking Invokana more regularly.  Other issue is upper extremity tremor.  Worse with caffeine.  He had episode of depression and was treated with Lexapro and Wellbutrin and felt like that worsened his tremor.  Tremor has improved slightly since tapering off those.  No known family history of tremor.  He has had this tremor for at least 5 years.  Worse with exertion and activity  Past Medical History:  Diagnosis Date  . HYPERLIPIDEMIA 02/26/2010   Past Surgical History:  Procedure Laterality Date  . GANGLION CYST EXCISION  1964   left wrist    reports that he quit smoking about 7 years ago. His smoking use included cigarettes. He has a 5.00 pack-year smoking history. He has never used smokeless tobacco. He reports that he does not drink alcohol or use drugs. family history includes Alcohol abuse in his father; Heart disease in his mother; Hypertension in his mother. Allergies  Allergen Reactions  . Crestor [Rosuvastatin Calcium]     myalgias  . Metformin And Related Diarrhea     Review of Systems  Constitutional: Negative for appetite change, fatigue  and unexpected weight change.  Eyes: Negative for visual disturbance.  Respiratory: Negative for cough, chest tightness and shortness of breath.   Cardiovascular: Negative for chest pain, palpitations and leg swelling.  Neurological: Positive for tremors. Negative for dizziness, syncope, weakness, light-headedness and headaches.       Objective:   Physical Exam Constitutional:      Appearance: He is well-developed.  HENT:     Right Ear: External ear normal.     Left Ear: External ear normal.  Eyes:     Pupils: Pupils are equal, round, and reactive to light.  Neck:     Musculoskeletal: Neck supple.     Thyroid: No thyromegaly.  Cardiovascular:     Rate and Rhythm: Normal rate and regular rhythm.  Pulmonary:     Effort: Pulmonary effort is normal. No respiratory distress.     Breath sounds: Normal breath sounds. No wheezing or rales.  Neurological:     General: No focal deficit present.     Mental Status: He is alert and oriented to person, place, and time.     Comments: He has very subtle tremor at rest.  No cogwheel rigidity.  Normal gait.        Assessment:     #1 hypertension improved since going back on losartan  #2 type 2 diabetes poorly controlled with recent A1c 8.8%.  Previous intolerance with Metformin  #3 dyslipidemia.  Prior intolerance with statins.  Currently on  Zetia.  Recent lipids reviewed  #4 recent atypical headache which is slightly improved.  CT head no acute abnormality.  #5 tremor which clinically looks more like essential tremor.    Plan:     -We discussed starting Rybelsus 3 mg daily for 30 days and then increase to 7 mg -Continue losartan 100 mg daily with refills given -Continue to minimize caffeine intake -Discussed potential options for tremor such as beta-blocker or Mysoline and at this point wishes to observe -We will plan routine follow-up in 3 months and sooner as needed -Continue weight loss efforts  Eulas Post MD [place]  Primary Care at [facility]

## 2019-03-08 NOTE — Telephone Encounter (Signed)
The patient was in the office today and forgot to ask Burchette to put in orders for CMP, Lipid and A1C before his 3 months follow up.  Can you have Burchette to add the lab orders please?

## 2019-03-09 DIAGNOSIS — I10 Essential (primary) hypertension: Secondary | ICD-10-CM | POA: Insufficient documentation

## 2019-03-11 NOTE — Telephone Encounter (Signed)
I see that these labs were done on 02/26/19. I will call patient to advise.

## 2019-04-24 ENCOUNTER — Other Ambulatory Visit: Payer: Self-pay | Admitting: Family Medicine

## 2019-04-29 ENCOUNTER — Encounter: Payer: Self-pay | Admitting: Family Medicine

## 2019-04-29 ENCOUNTER — Ambulatory Visit: Payer: Medicare Other | Attending: Internal Medicine

## 2019-04-29 DIAGNOSIS — Z20822 Contact with and (suspected) exposure to covid-19: Secondary | ICD-10-CM

## 2019-04-30 ENCOUNTER — Other Ambulatory Visit: Payer: Self-pay

## 2019-04-30 ENCOUNTER — Telehealth (INDEPENDENT_AMBULATORY_CARE_PROVIDER_SITE_OTHER): Payer: Medicare Other | Admitting: Family Medicine

## 2019-04-30 DIAGNOSIS — R531 Weakness: Secondary | ICD-10-CM

## 2019-04-30 DIAGNOSIS — R509 Fever, unspecified: Secondary | ICD-10-CM

## 2019-04-30 LAB — NOVEL CORONAVIRUS, NAA: SARS-CoV-2, NAA: DETECTED — AB

## 2019-04-30 MED ORDER — ONETOUCH VERIO FLEX SYSTEM W/DEVICE KIT: PACK | 1 refills | Status: AC

## 2019-04-30 MED ORDER — ONETOUCH VERIO VI STRP: ORAL_STRIP | 1 refills | Status: DC

## 2019-04-30 MED ORDER — ONETOUCH DELICA LANCETS 33G MISC: 3 refills | Status: DC

## 2019-04-30 NOTE — Progress Notes (Signed)
This visit type was conducted due to national recommendations for restrictions regarding the COVID-19 pandemic in an effort to limit this patient's exposure and mitigate transmission in our community.   Virtual Visit via Telephone Note  I connected with Luis Ross on 04/30/19 at 10:45 AM EST by telephone and verified that I am speaking with the correct person using two identifiers.   I discussed the limitations, risks, security and privacy concerns of performing an evaluation and management service by telephone and the availability of in person appointments. I also discussed with the patient that there may be a patient responsible charge related to this service. The patient expressed understanding and agreed to proceed.  Location patient: home Location provider: work or home office Participants present for the call: patient, provider Patient did not have a visit in the prior 7 days to address this/these issue(s).   History of Present Illness: Luis Ross has had respiratory symptoms and low-grade fever now for almost 10 days.  He states he developed some cough initially along with body aches and nasal congestion.  Took some Mucinex and his cough has actually been somewhat better since the first few days of this.  His O2 sats usually run around 97% and have been running around 94%.  Has had some progressive generalized weakness.  He noted low blood pressure couple days ago and stopped his losartan.  Only one episode of diarrhea.  2 days ago he had some vomiting.  He had started recently on Rybelsus and has held the Rybelsus and has kept down fluids and a little bit of food today.  Has continued to run low-grade fevers around 99.6-100.2.  Had flu vaccine earlier.  No known sick contacts.  His daughter and son-in-law and patient all got tested for Covid yesterday.  Tests are still pending.  He does not have any significant dyspnea at rest.  Does have significant generalized weakness.  Requesting new home  glucometer and test strips  Past Medical History:  Diagnosis Date  . HYPERLIPIDEMIA 02/26/2010   Past Surgical History:  Procedure Laterality Date  . GANGLION CYST EXCISION  1964   left wrist    reports that he quit smoking about 7 years ago. His smoking use included cigarettes. He has a 5.00 pack-year smoking history. He has never used smokeless tobacco. He reports that he does not drink alcohol or use drugs. family history includes Alcohol abuse in his father; Heart disease in his mother; Hypertension in his mother. Allergies  Allergen Reactions  . Crestor [Rosuvastatin Calcium]     myalgias  . Metformin And Related Diarrhea      Observations/Objective:  I do not appreciate any SOB. Speech and thought processing are grossly intact.  Does have significant hoarseness. Patient reported vitals: O2 saturation is 94%  Assessment and Plan:  Almost 10-day history of respiratory symptoms above.  Fairly strong clinical suspicion for Covid.  Patient was tested yesterday and still pending  -Plenty of fluids and rest -Discussed antinausea medications but he feels more stable at this time and keeping down fluids today -We discussed respiratory clinic over at [facility] and at this point he would like to observe and see how things go today.  If not improved by tomorrow recommend he be evaluated there and sooner as needed -Sent in new glucometer for home use along with test strips -Continue quarantine for now  Follow Up Instructions:  - as above.   99441 5-10 [id number] 11-20 [id number] 21-30 I did not refer this patient for  an OV in the next 24 hours for this/these issue(s).  I discussed the assessment and treatment plan with the patient. The patient was provided an opportunity to ask questions and all were answered. The patient agreed with the plan and demonstrated an understanding of the instructions.   The patient was advised to call back or seek an in-person evaluation if the symptoms worsen  or if the condition fails to improve as anticipated.  I provided 15 minutes of non-face-to-face time during this encounter.   Evelena Peat, MD

## 2019-05-01 NOTE — Progress Notes (Signed)
Spoke with Luis Ross again today and he is feeling better.  Still fatigued but no nausea and keeping fluids and some food down.  Pulse ox 95%   He wants to hold on labs at this time.

## 2019-05-02 ENCOUNTER — Telehealth: Payer: Self-pay | Admitting: Nurse Practitioner

## 2019-05-02 ENCOUNTER — Telehealth: Payer: Self-pay | Admitting: Family Medicine

## 2019-05-02 NOTE — Telephone Encounter (Signed)
Copied from CRM [id number]. Topic: General - Other >> May 02, 2019  4:14 PM Tamela Oddi wrote: Reason for CRM: Patient's wife called to ask if the office received a form from the Medicare Part B that needs to be filled out before patient can get prescriptions filled.  CB# [phone] (Lorrie Nauta)

## 2019-05-02 NOTE — Telephone Encounter (Signed)
Called to Discuss with patient about Covid symptoms and the use of bamlanivimab, a monoclonal antibody infusion for those with mild to moderate Covid symptoms and at a high risk of hospitalization.     Pt is qualified for this infusion at the [facility] due to co-morbid conditions and/or a member of an at-risk group.     Unable to reach pt  

## 2019-05-02 NOTE — Telephone Encounter (Signed)
Message Routed to PCP CMA 

## 2019-05-03 NOTE — Telephone Encounter (Signed)
Called Luis Ross and she was not sure what the insurance needed so I asked for her to contact Karin Golden and see if the [employer] is covered by his insurance or if he needs a different meter that is covered. Luis Ross verbalized an understanding and will call back to let us know which glucose meter is covered by insurance.  OK for [facility] to discuss and/or advise.  CRM Created.

## 2019-05-06 ENCOUNTER — Encounter: Payer: Self-pay | Admitting: Family Medicine

## 2019-05-06 ENCOUNTER — Other Ambulatory Visit: Payer: Self-pay | Admitting: Family Medicine

## 2019-06-01 ENCOUNTER — Ambulatory Visit: Payer: Medicare Other

## 2019-06-03 ENCOUNTER — Encounter: Payer: Self-pay | Admitting: Family Medicine

## 2019-06-03 ENCOUNTER — Other Ambulatory Visit: Payer: Self-pay | Admitting: Family Medicine

## 2019-06-03 ENCOUNTER — Telehealth: Payer: Self-pay | Admitting: Family Medicine

## 2019-06-03 ENCOUNTER — Other Ambulatory Visit: Payer: Self-pay

## 2019-06-03 ENCOUNTER — Other Ambulatory Visit (INDEPENDENT_AMBULATORY_CARE_PROVIDER_SITE_OTHER): Payer: Medicare Other

## 2019-06-03 DIAGNOSIS — E1169 Type 2 diabetes mellitus with other specified complication: Secondary | ICD-10-CM

## 2019-06-03 DIAGNOSIS — E785 Hyperlipidemia, unspecified: Secondary | ICD-10-CM | POA: Diagnosis not present

## 2019-06-03 DIAGNOSIS — E1165 Type 2 diabetes mellitus with hyperglycemia: Secondary | ICD-10-CM | POA: Diagnosis not present

## 2019-06-03 LAB — LDL CHOLESTEROL, DIRECT: Direct LDL: 122 mg/dL

## 2019-06-03 LAB — COMPREHENSIVE METABOLIC PANEL
ALT: 28 U/L (ref 0–53)
AST: 17 U/L (ref 0–37)
Albumin: 4.6 g/dL (ref 3.5–5.2)
Alkaline Phosphatase: 55 U/L (ref 39–117)
BUN: 19 mg/dL (ref 6–23)
CO2: 24 mEq/L (ref 19–32)
Calcium: 9.4 mg/dL (ref 8.4–10.5)
Chloride: 102 mEq/L (ref 96–112)
Creatinine, Ser: 0.75 mg/dL (ref 0.40–1.50)
GFR: 103.94 mL/min (ref >60.00)
Glucose, Bld: 135 mg/dL — ABNORMAL HIGH (ref 70–99)
Potassium: 4.7 mEq/L (ref 3.5–5.1)
Sodium: 136 mEq/L (ref 135–145)
Total Bilirubin: 0.5 mg/dL (ref 0.2–1.2)
Total Protein: 7.5 g/dL (ref 6.0–8.3)

## 2019-06-03 LAB — LIPID PANEL
Cholesterol: 197 mg/dL (ref 0–200)
HDL: 29.7 mg/dL — ABNORMAL LOW (ref >39.00)
NonHDL: 167.62
Total CHOL/HDL Ratio: 7
Triglycerides: 230 mg/dL — ABNORMAL HIGH (ref 0.0–149.0)
VLDL: 46 mg/dL — ABNORMAL HIGH (ref 0.0–40.0)

## 2019-06-03 LAB — HEMOGLOBIN A1C: Hgb A1c MFr Bld: 6.9 % — ABNORMAL HIGH (ref 4.6–6.5)

## 2019-06-03 NOTE — Progress Notes (Signed)
Patient is coming in later today for labs.  Order placed for A1c, CMP, and lipid panel

## 2019-06-03 NOTE — Telephone Encounter (Signed)
Orders are placed again as future orders for A1c, lipid panel, and CMP

## 2019-06-07 ENCOUNTER — Other Ambulatory Visit: Payer: Self-pay

## 2019-06-10 ENCOUNTER — Encounter: Payer: Self-pay | Admitting: Family Medicine

## 2019-06-10 ENCOUNTER — Ambulatory Visit (INDEPENDENT_AMBULATORY_CARE_PROVIDER_SITE_OTHER): Payer: Medicare Other | Admitting: Family Medicine

## 2019-06-10 ENCOUNTER — Other Ambulatory Visit: Payer: Self-pay

## 2019-06-10 VITALS — BP 124/82 | HR 98 | Temp 97.7°F | Ht 74.0 in | Wt 214.0 lb

## 2019-06-10 DIAGNOSIS — E1165 Type 2 diabetes mellitus with hyperglycemia: Secondary | ICD-10-CM | POA: Diagnosis not present

## 2019-06-10 DIAGNOSIS — E785 Hyperlipidemia, unspecified: Secondary | ICD-10-CM

## 2019-06-10 DIAGNOSIS — Z8616 Personal history of COVID-19: Secondary | ICD-10-CM

## 2019-06-10 MED ORDER — RYBELSUS 3 MG PO TABS: 3.0000 mg | ORAL_TABLET | Freq: Every day | ORAL | 0 refills | Status: DC

## 2019-06-10 MED ORDER — LOSARTAN POTASSIUM 25 MG PO TABS: 25.0000 mg | ORAL_TABLET | Freq: Every day | ORAL | 3 refills | Status: DC

## 2019-06-10 NOTE — Progress Notes (Signed)
Subjective:     Patient ID: Luis Ross, male   DOB: Sep 17, 1950, 69 y.o.   MRN: [id number]  HPI [name] is seen today for medical follow-up.  He had Covid infection recently and is recovered from that.  He had his first vaccine last week.Marland Kitchen  He lost 18 pounds with his illness and A1c did improve to 6.9% likely reflecting his recent weight loss.  He has gained back 2 pounds.  He had developed some nausea and even occasional vomiting at the onset of his Covid and was not sure if this was secondary to Covid or secondary to GLP-1 medication.  He had been on Rybelsus 7 mg daily at that time.  He would like to consider going back on 3 mg this point see if he tolerates it.  Recent lipids reviewed.  His cholesterol is back up.  Previous intolerance with statin.  He would like to try low-dose pravastatin again.  He has 20 mg and would like to take 1/2 tablet.  No longer taking Zetia.  He had been on losartan but we had instructed him to stop this because of his weight loss and dehydration.  He would like to go back on 25 mg now for renal protection  Past Medical History:  Diagnosis Date  . HYPERLIPIDEMIA 02/26/2010   Past Surgical History:  Procedure Laterality Date  . GANGLION CYST EXCISION  1964   left wrist    reports that he quit smoking about 7 years ago. His smoking use included cigarettes. He has a 5.00 pack-year smoking history. He has never used smokeless tobacco. He reports that he does not drink alcohol or use drugs. family history includes Alcohol abuse in his father; Heart disease in his mother; Hypertension in his mother. Allergies  Allergen Reactions  . Crestor [Rosuvastatin Calcium]     myalgias  . Metformin And Related Diarrhea     Review of Systems  Constitutional: Negative for fatigue.  Eyes: Negative for visual disturbance.  Respiratory: Negative for cough, chest tightness and shortness of breath.   Cardiovascular: Negative for chest pain, palpitations and leg swelling.   Neurological: Negative for dizziness, syncope, weakness, light-headedness and headaches.       Objective:   Physical Exam Constitutional:      Appearance: He is well-developed.  HENT:     Right Ear: External ear normal.     Left Ear: External ear normal.  Eyes:     Pupils: Pupils are equal, round, and reactive to light.  Neck:     Thyroid: No thyromegaly.  Cardiovascular:     Rate and Rhythm: Normal rate and regular rhythm.  Pulmonary:     Effort: Pulmonary effort is normal. No respiratory distress.     Breath sounds: Normal breath sounds. No wheezing or rales.  Musculoskeletal:     Cervical back: Neck supple.     Right lower leg: No edema.     Left lower leg: No edema.  Neurological:     Mental Status: He is alert and oriented to person, place, and time.        Assessment:     #1 recent Covid infection fully recovered and in process of getting vaccinated  #2 type 2 diabetes improved with recent weight loss from his illness.  #3 dyslipidemia with prior intolerance with most statins    Plan:     -Start back losartan 25 mg daily.  We are using this more for renal protection  -Start back Rybelsus 3 mg  daily.  Will plan release 1 month of this and if tolerated at that point consider increasing to 7 mg.  -Recheck A1c and lipids in 6 to 8 weeks  -Start back low-dose pravastatin 10 mg daily.  He will take 1/2 of 20 mg tablet which she has at home  Kristian Covey MD [facility] at [facility]

## 2019-06-19 ENCOUNTER — Ambulatory Visit: Payer: Medicare Other

## 2019-07-19 ENCOUNTER — Other Ambulatory Visit: Payer: Self-pay

## 2019-07-22 ENCOUNTER — Other Ambulatory Visit: Payer: Self-pay

## 2019-07-22 ENCOUNTER — Encounter: Payer: Self-pay | Admitting: Family Medicine

## 2019-07-22 ENCOUNTER — Other Ambulatory Visit (INDEPENDENT_AMBULATORY_CARE_PROVIDER_SITE_OTHER): Payer: Medicare Other

## 2019-07-22 DIAGNOSIS — E785 Hyperlipidemia, unspecified: Secondary | ICD-10-CM

## 2019-07-22 DIAGNOSIS — E1165 Type 2 diabetes mellitus with hyperglycemia: Secondary | ICD-10-CM

## 2019-07-22 LAB — LIPID PANEL
Cholesterol: 147 mg/dL (ref 0–200)
HDL: 34.7 mg/dL — ABNORMAL LOW (ref >39.00)
LDL Cholesterol: 80 mg/dL (ref 0–99)
NonHDL: 112.71
Total CHOL/HDL Ratio: 4
Triglycerides: 163 mg/dL — ABNORMAL HIGH (ref 0.0–149.0)
VLDL: 32.6 mg/dL (ref 0.0–40.0)

## 2019-07-22 LAB — HEPATIC FUNCTION PANEL
ALT: 19 U/L (ref 0–53)
AST: 15 U/L (ref 0–37)
Albumin: 4.7 g/dL (ref 3.5–5.2)
Alkaline Phosphatase: 48 U/L (ref 39–117)
Bilirubin, Direct: 0.1 mg/dL (ref 0.0–0.3)
Total Bilirubin: 0.7 mg/dL (ref 0.2–1.2)
Total Protein: 7.2 g/dL (ref 6.0–8.3)

## 2019-07-22 LAB — HEMOGLOBIN A1C: Hgb A1c MFr Bld: 6.3 % (ref 4.6–6.5)

## 2019-07-22 MED ORDER — CANAGLIFLOZIN 300 MG PO TABS: 300.0000 mg | ORAL_TABLET | Freq: Every day | ORAL | 3 refills | Status: DC

## 2019-09-03 ENCOUNTER — Other Ambulatory Visit: Payer: Self-pay

## 2019-09-04 ENCOUNTER — Other Ambulatory Visit (INDEPENDENT_AMBULATORY_CARE_PROVIDER_SITE_OTHER): Payer: Medicare Other

## 2019-09-04 ENCOUNTER — Ambulatory Visit
Admission: RE | Admit: 2019-09-04 | Discharge: 2019-09-04 | Disposition: A | Discharge status: Home / Self Care | Payer: Medicare Other | Source: Ambulatory Visit | Attending: Family Medicine | Admitting: Family Medicine

## 2019-09-04 ENCOUNTER — Other Ambulatory Visit: Payer: Self-pay

## 2019-09-04 ENCOUNTER — Telehealth: Payer: Self-pay | Admitting: Family Medicine

## 2019-09-04 DIAGNOSIS — E119 Type 2 diabetes mellitus without complications: Secondary | ICD-10-CM | POA: Diagnosis not present

## 2019-09-04 DIAGNOSIS — M5432 Sciatica, left side: Secondary | ICD-10-CM

## 2019-09-04 DIAGNOSIS — E1165 Type 2 diabetes mellitus with hyperglycemia: Secondary | ICD-10-CM

## 2019-09-04 LAB — HEMOGLOBIN A1C: Hgb A1c MFr Bld: 7 % — ABNORMAL HIGH (ref 4.6–6.5)

## 2019-09-04 NOTE — Telephone Encounter (Signed)
Spoke with Luis Ross.  He has had some significant myalgias and thought initially this was pravastatin.  Has had intolerance with multiple statins in the past.  However, even after stopping pravastatin had persistence of myalgias and some low back pain.  He is recently noted question of some bilateral quadriceps weakness and has had some intermittent sharp pains radiating left lumbar back down the left lower extremity.  No lower extremity numbness.  No urine or stool incontinence.  No definite weakness with plantar or dorsiflexion.  He did have Covid infection several months ago and wonders if this could be somehow related.  We discussed starting out with some lumbosacral spine films.  No prior back surgery.  Ordered LS spine series and patient requested through [facility] radiology.  He has follow-up in office in a few days and will discuss at that time

## 2019-09-05 ENCOUNTER — Encounter: Payer: Self-pay | Admitting: Family Medicine

## 2019-09-06 ENCOUNTER — Encounter: Payer: Self-pay | Admitting: Family Medicine

## 2019-09-07 NOTE — Addendum Note (Signed)
Addended by: Kristian Covey on: 09/07/2019 04:57 PM   Modules accepted: Orders

## 2019-09-09 ENCOUNTER — Ambulatory Visit (INDEPENDENT_AMBULATORY_CARE_PROVIDER_SITE_OTHER): Payer: Medicare Other | Admitting: Family Medicine

## 2019-09-09 ENCOUNTER — Encounter: Payer: Self-pay | Admitting: Family Medicine

## 2019-09-09 VITALS — BP 134/72 | HR 95 | Temp 98.0°F | Wt 216.8 lb

## 2019-09-09 DIAGNOSIS — E119 Type 2 diabetes mellitus without complications: Secondary | ICD-10-CM

## 2019-09-09 DIAGNOSIS — E785 Hyperlipidemia, unspecified: Secondary | ICD-10-CM | POA: Diagnosis not present

## 2019-09-09 DIAGNOSIS — M48061 Spinal stenosis, lumbar region without neurogenic claudication: Secondary | ICD-10-CM | POA: Diagnosis not present

## 2019-09-09 DIAGNOSIS — M5432 Sciatica, left side: Secondary | ICD-10-CM | POA: Diagnosis not present

## 2019-09-09 LAB — POCT GLYCOSYLATED HEMOGLOBIN (HGB A1C)

## 2019-09-09 MED ORDER — RYBELSUS 7 MG PO TABS: 7.0000 mg | ORAL_TABLET | Freq: Every day | ORAL | 3 refills | Status: DC

## 2019-09-09 NOTE — Progress Notes (Signed)
Subjective:     Patient ID: Luis Ross, male   DOB: May 05, 1950, 69 y.o.   MRN: [id number]  HPI Luis Ross is seen for medical follow-up.  6 to 63-month history of low back pain left-sided.  He had some sciatica symptoms down to the ankle.  Worse after ambulation.  Some increased night pain in both hips.  He is also noticed some increased bilateral quadriceps weakness.  No urine or stool incontinence.  No numbness.  He has taken Advil and has tried extension stretches without improvement.  His pain if anything his progress.  We had some plain films recently which showed narrowing L2-L3.  MRI has been scheduled.  No prior back surgery.  Type 2 diabetes.  He lost a lot of weight after Covid infection during the spring and his A1c dropped from 8.8% down to 6.3%.  He is recently had rebound and most recent A1c 7.0%.  He is currently on Rybelsus 3 mg daily and would like to consider titration up to 7.  He is tolerating without nausea.  Recent fasting blood sugars 160s and he had blood sugar prior to bedtime of 212 recently.  He has been limited in exercise because of back situation.  Hyperlipidemia and intolerance of statins in the past.  Currently not taking statin.  He takes Avandia intermittently.  Is had some myalgias even with Zetia.  Most recent lipids reviewed  Past Medical History:  Diagnosis Date  . HYPERLIPIDEMIA 02/26/2010   Past Surgical History:  Procedure Laterality Date  . GANGLION CYST EXCISION  1964   left wrist    reports that he quit smoking about 7 years ago. His smoking use included cigarettes. He has a 5.00 pack-year smoking history. He has never used smokeless tobacco. He reports that he does not drink alcohol or use drugs. family history includes Alcohol abuse in his father; Heart disease in his mother; Hypertension in his mother. Allergies  Allergen Reactions  . Crestor [Rosuvastatin Calcium]     myalgias  . Metformin And Related Diarrhea       Review of Systems   Constitutional: Negative for fatigue and unexpected weight change.  Eyes: Negative for visual disturbance.  Respiratory: Negative for cough, chest tightness and shortness of breath.   Cardiovascular: Negative for chest pain, palpitations and leg swelling.  Endocrine: Negative for polydipsia and polyuria.  Musculoskeletal: Positive for back pain.  Neurological: Negative for dizziness, syncope, light-headedness and headaches.       Objective:   Physical Exam Constitutional:      Appearance: He is well-developed.  HENT:     Right Ear: External ear normal.     Left Ear: External ear normal.  Eyes:     Pupils: Pupils are equal, round, and reactive to light.  Neck:     Thyroid: No thyromegaly.  Cardiovascular:     Rate and Rhythm: Normal rate and regular rhythm.  Pulmonary:     Effort: Pulmonary effort is normal. No respiratory distress.     Breath sounds: Normal breath sounds. No wheezing or rales.  Musculoskeletal:     Cervical back: Neck supple.  Neurological:     Mental Status: He is alert and oriented to person, place, and time.     Comments: Deep to reflexes are 2+ knee bilaterally and 2+ right ankle and trace to 1+ left ankle.  He seems to have some general weakness with knee extension bilaterally.        Assessment:     #1  type 2 diabetes fairly well controlled with recent A1c 7.0%.  He has had some recent increase fasting blood sugars  #2 low back pain with left-sided sciatica.  Progressive over several months.  Not relieved with conservative measures.  MRI pending  #3 dyslipidemia.  Prior intolerance with multiple statins    Plan:     -Increase Rybelsus to 7 mg -Future labs for A1c and lipid in about 3 months -MRI lumbar spine pending as above -Continue walking and exercise as tolerated  Kristian Covey MD [facility] at [facility]

## 2019-09-09 NOTE — Telephone Encounter (Signed)
Pt has appt today nothing further needed

## 2019-10-09 ENCOUNTER — Other Ambulatory Visit: Payer: Self-pay

## 2019-10-09 ENCOUNTER — Ambulatory Visit
Admission: RE | Admit: 2019-10-09 | Discharge: 2019-10-09 | Disposition: A | Discharge status: Home / Self Care | Payer: Medicare Other | Source: Ambulatory Visit | Attending: Family Medicine | Admitting: Family Medicine

## 2019-10-09 DIAGNOSIS — M5432 Sciatica, left side: Secondary | ICD-10-CM

## 2019-10-09 NOTE — Addendum Note (Signed)
Addended by: Kristian Covey on: 10/09/2019 12:30 PM   Modules accepted: Orders

## 2019-10-10 ENCOUNTER — Encounter: Payer: Self-pay | Admitting: Physical Therapy

## 2019-10-10 ENCOUNTER — Ambulatory Visit: Payer: Medicare Other | Attending: Family Medicine | Admitting: Physical Therapy

## 2019-10-10 ENCOUNTER — Other Ambulatory Visit: Payer: Self-pay

## 2019-10-10 DIAGNOSIS — M25552 Pain in left hip: Secondary | ICD-10-CM | POA: Insufficient documentation

## 2019-10-10 DIAGNOSIS — M545 Low back pain, unspecified: Secondary | ICD-10-CM

## 2019-10-10 DIAGNOSIS — M25651 Stiffness of right hip, not elsewhere classified: Secondary | ICD-10-CM

## 2019-10-10 DIAGNOSIS — M25652 Stiffness of left hip, not elsewhere classified: Secondary | ICD-10-CM | POA: Diagnosis present

## 2019-10-10 DIAGNOSIS — M25551 Pain in right hip: Secondary | ICD-10-CM | POA: Insufficient documentation

## 2019-10-10 DIAGNOSIS — G8929 Other chronic pain: Secondary | ICD-10-CM | POA: Diagnosis present

## 2019-10-10 NOTE — Therapy (Signed)
[facility] [address], [address] [place], [place], [address] Phone: [phone]   Fax:  [phone]  Physical Therapy Evaluation  Patient Details  Name: Luis Ross MRN: [id number] Date of Birth: 08/25/1950 Referring Provider (PT): Kristian Covey, MD   Encounter Date: 10/10/2019   PT End of Session - 10/10/19 1442    Visit Number 1    Date for PT Re-Evaluation 12/05/19    PT Start Time 1100    PT Stop Time 1145    PT Time Calculation (min) 45 min    Activity Tolerance Patient tolerated treatment well    Behavior During Therapy [facility] for tasks assessed/performed           Past Medical History:  Diagnosis Date  . HYPERLIPIDEMIA 02/26/2010    Past Surgical History:  Procedure Laterality Date  . GANGLION CYST EXCISION  1964   left wrist    There were no vitals filed for this visit.    Subjective Assessment - 10/10/19 1104    Subjective Pain in left leg when walking began about 6 months ago.  Both sides have a lot of myalgias in the hips that started after getting COVID and then possibly related to statin drugs, but he is unsure.    Limitations Walking;Standing;House hold activities    How long can you sit comfortably? no pain    How long can you walk comfortably? 1/4 mile then pain down the Lt leg    Patient Stated Goals 3 mile/day walking and have    Currently in Pain? Yes    Pain Score 7    when transitioning or changing positions   Pain Location Back    Pain Orientation Right;Left    Pain Descriptors / Indicators Tightness;Sharp;Sore    Pain Type Chronic pain    Pain Radiating Towards down left leg posterior to ankle; bilateral gluteals and hips    Pain Onset More than a month ago    Pain Frequency Intermittent    Aggravating Factors  walking; bed mob and sit<>stand/car transfers    Pain Relieving Factors sitting is pain free    Effect of Pain on Daily Activities walking and getting up and down     Multiple Pain Sites No              [facility] PT Assessment - 10/10/19 0001      Assessment   Medical Diagnosis M48.061 (ICD-10-CM) - Spinal stenosis of lumbar region, unspecified whether neurogenic claudication present    Referring Provider (PT) Kristian Covey, MD    Prior Therapy none      Precautions   Precautions None      Balance Screen   Has the patient fallen in the past 6 months No      Home Environment   Living Environment Private residence    Living Arrangements Spouse/significant other      Prior Function   Level of Independence Independent    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   Leisure walking - would like to get back to      [employer]   Overall Cognitive Status Within Functional Limits for tasks assessed      Observation/Other Assessments   Observations UE support when getter up and difficulty with hip extension initially; antaligic gait when he first stands and then evens out after walking several feet    Focus on Therapeutic Outcomes (FOTO)  43% limited      Posture/Postural  Control   Posture/Postural Control Postural limitations    Postural Limitations Rounded Shoulders;Increased thoracic kyphosis;Forward head;Posterior pelvic tilt      ROM / Strength   AROM / PROM / Strength PROM;Strength;AROM      AROM   Overall AROM Comments lumbar flexion is poor      PROM   Overall PROM Comments hip IR/ER 50% +pain bilateral; flexion WNL      Strength   Overall Strength Comments hip 5/5 except Rt hip abduction and adcuciton +pain; Lt hip flexion +pain      Flexibility   Soft Tissue Assessment /Muscle Length yes    Hamstrings Rt 60%; Lt 75%      Palpation   Palpation comment TTP bilateral glute med; tight gluteals and lumbar paraspinals; hypomobility bilateral hip rotation      Special Tests    Special Tests Lumbar    Lumbar Tests FABER test;Straight Leg Raise;other      FABER test   findings Positive    Comment bilateral      Straight Leg  Raise   Findings Negative    Comment bilateral      other   Comments ASLR negative      Transfers   Comments need UE support and very slow      Ambulation/Gait   Gait Pattern Decreased stride length;Trunk flexed;Antalgic                      Objective measurements completed on examination: See above findings.       [facility] Adult PT Treatment/Exercise - 10/10/19 0001      Self-Care   Self-Care Other Self-Care Comments    Other Self-Care Comments  intial HEP educated and performed            Trigger Point Dry Needling - 10/10/19 0001    Education Handout Provided Yes                PT Education - 10/10/19 1445    Education Details Access Code: [email or web address]    Person(s) Educated Patient    Methods Explanation;Demonstration;Verbal cues;Handout    Comprehension Verbalized understanding;Returned demonstration;Verbal cues required;Tactile cues required            PT Short Term Goals - 10/10/19 1559      PT SHORT TERM GOAL #1   Title ind with initial HEP    Time 4    Period Weeks    Status New    Target Date 11/07/19      PT SHORT TERM GOAL #2   Title Pt will report 50% less pain at night when rolling over in bed    Baseline pt sleeps in water bed    Time 4    Period Weeks    Status New    Target Date 11/07/19      PT SHORT TERM GOAL #3   Title Pt will demonstrate hip strength 5/5 without pain for improved functional transfers and walking    Time 4    Period Weeks    Status New    Target Date 11/07/19             PT Long Term Goals - 10/10/19 1600      PT LONG TERM GOAL #1   Title FOTO < or = to 33%    Time 8    Period Weeks    Status New    Target Date 12/05/19      PT  LONG TERM GOAL #2   Title Pt will be able to perform sit to stand x 5 without UE support and 3/10 pain at most    Time 8    Period Weeks    Status New    Target Date 12/05/19      PT LONG TERM GOAL #3   Title Pt will be able to walk or ride recumbant bike  for at least 30 minutes/day without increased pain    Time 8    Period Weeks    Status New    Target Date 12/05/19      PT LONG TERM GOAL #4   Title Pt will be ind with advanced HEP in order to maintain improved mobility, strength, and pain management    Time 8    Period Weeks    Status New    Target Date 12/05/19                  Plan - 10/10/19 1447    Clinical Impression Statement Pt presents to clinic with several symptoms of unclear origins.  Of note, patient had COVID last year with a high amount of myalgia.  He has also noticed increased pain at night when taking statins, but has not noticed it getting much better after he stopped taking them.  Pt had lumbar MRI with severe stenosis noted at L5/S1 on the left side.  Pt demonstrates difficulty performing sit to stand with bilateral hip pain going from flexion to extension and pain with increased hip flexion in seated position.  Pt has LE weakness Rt hip abduction/adduction +pain and Lt hip flexion +pain.  He has weakness of calves bilaterally but Lt worse than Rt with compensations bilateral.  Rt knee slightly flexed in standing.  Sits with slumped posture and rounded shoulders and tends to lean on the outside of the left hip, but he is able to correct without pain when cued.  Pt has diastasis with tenting superior to the umbilicus.  Bilateral hips are hypomobile with pain in IR/ER.  Hip flexion full and pain free ROM.  Pt has tenderness to bilateral gluteals.  P/SLR and A/SLR tests are negative.  No pain with overpressure to lumbar spine, but had good response to PA mobs to T10-12 and was hypomobile in that lower thoracic region.  Pt will benefit from skilled PT to address impairments as mentioned beginning with soft tissue length of glutes and hamstrings and improved hip rotatoin and mobility along the thoracic and lumbar spine.    Personal Factors and Comorbidities Time since onset of injury/illness/exacerbation;Comorbidity 2     Comorbidities complications from COVID, spinal stenosis    Examination-Activity Limitations Transfers;Locomotion Level    Examination-Participation Restrictions Community Activity    Stability/Clinical Decision Making Evolving/Moderate complexity    Clinical Decision Making Moderate    Rehab Potential Excellent    PT Frequency 2x / week    PT Duration 8 weeks    PT Treatment/Interventions ADLs/Self Care Home Management;Biofeedback;Cryotherapy;Electrical Stimulation;Iontophoresis 4mg /ml Dexamethasone;Moist Heat;Traction;Ultrasound;Therapeutic activities;Therapeutic exercise;Neuromuscular re-education;Manual techniques;Patient/family education;Dry needling;Passive range of motion;Taping    PT Next Visit Plan soft tissue length of glutes and hamstrings STM and stretch; hip rotation mobs and long axis distraction; low thoracic P/A mobs; begin core strength if time allows    PT Home Exercise Plan Access Code: [email or web address]    Consulted and Agree with Plan of Care Patient           Patient will benefit from skilled therapeutic intervention in  order to improve the following deficits and impairments:  Abnormal gait, Decreased range of motion, Difficulty walking, Increased fascial restricitons, Hypomobility, Impaired flexibility, Decreased strength, Pain, Increased muscle spasms  Visit Diagnosis: Chronic low back pain, unspecified back pain laterality, unspecified whether sciatica present  Pain in left hip  Pain in right hip  Stiffness of right hip, not elsewhere classified  Stiffness of left hip, not elsewhere classified     Problem List Patient Active Problem List   Diagnosis Date Noted  . Essential hypertension 03/09/2019  . Rosacea 01/17/2016  . Type 2 diabetes mellitus with hyperglycemia ([place]) 08/30/2014  . Type 2 diabetes mellitus ([place]) 07/06/2013  . Nicotine use disorder 04/16/2011  . Hyperlipidemia 02/26/2010    Jule Ser, PT 10/10/2019, 5:47 PM  [facility] [address] [place], [place], [address] Phone: [phone]   Fax:  [phone]  Name: Luis Ross MRN: [id number] Date of Birth: 04/06/1951

## 2019-10-10 NOTE — Patient Instructions (Signed)
Access Code: [email or web address] URL: [email or web address] Date: 10/10/2019 Prepared by: Dwana Curd  Exercises Supine Piriformis Stretch with Foot on Ground - 1 x daily - 7 x weekly - 3 reps - 1 sets - 30 sec hold Supine Single Knee to Chest Stretch - 2 x daily - 7 x weekly - 5 reps - 1 sets - 10 sec hold Supine Hamstring Stretch - 1 x daily - 7 x weekly - 3 reps - 1 sets - 30 sec hold Supine Lower Trunk Rotation - 1 x daily - 7 x weekly - 10 reps - 1 sets - 5 sec hold  Patient Education Trigger Point Dry Needling

## 2019-10-15 ENCOUNTER — Ambulatory Visit: Payer: Medicare Other | Admitting: Physical Therapy

## 2019-10-15 ENCOUNTER — Encounter: Payer: Self-pay | Admitting: Physical Therapy

## 2019-10-15 ENCOUNTER — Other Ambulatory Visit: Payer: Self-pay

## 2019-10-15 DIAGNOSIS — M25651 Stiffness of right hip, not elsewhere classified: Secondary | ICD-10-CM

## 2019-10-15 DIAGNOSIS — G8929 Other chronic pain: Secondary | ICD-10-CM

## 2019-10-15 DIAGNOSIS — M25551 Pain in right hip: Secondary | ICD-10-CM

## 2019-10-15 DIAGNOSIS — M545 Low back pain: Secondary | ICD-10-CM | POA: Diagnosis not present

## 2019-10-15 DIAGNOSIS — M25552 Pain in left hip: Secondary | ICD-10-CM

## 2019-10-15 DIAGNOSIS — M25652 Stiffness of left hip, not elsewhere classified: Secondary | ICD-10-CM

## 2019-10-15 NOTE — Therapy (Signed)
[facility] [address] [place], [place], [address] Phone: [phone]   Fax:  [phone]  Physical Therapy Treatment  Patient Details  Name: Luis Ross MRN: [id number] Date of Birth: 1950-12-10 Referring Provider (PT): Kristian Covey, MD   Encounter Date: 10/15/2019   PT End of Session - 10/15/19 1710    Visit Number 2    Date for PT Re-Evaluation 12/05/19    PT Start Time 0931    PT Stop Time 1012    PT Time Calculation (min) 41 min    Activity Tolerance Patient tolerated treatment well    Behavior During Therapy [facility] for tasks assessed/performed           Past Medical History:  Diagnosis Date  . HYPERLIPIDEMIA 02/26/2010    Past Surgical History:  Procedure Laterality Date  . GANGLION CYST EXCISION  1964   left wrist    There were no vitals filed for this visit.   Subjective Assessment - 10/15/19 0934    Subjective Pt states that things are going well. He had increased Rt gluteal pain since the evaluation. He has no pain currently.    Limitations Walking;Standing;House hold activities    How long can you sit comfortably? no pain    How long can you walk comfortably? 1/4 mile then pain down the Lt leg    Patient Stated Goals 3 mile/day walking and have    Currently in Pain? No/denies    Pain Onset More than a month ago                             [facility] Adult PT Treatment/Exercise - 10/15/19 0001      Exercises   Exercises Knee/Hip      Knee/Hip Exercises: Seated   Other Seated Knee/Hip Exercises long sitting hip IR/ER, avoiding pressure on Rt glute x5 reps, HEP demo       Knee/Hip Exercises: Supine   Other Supine Knee/Hip Exercises hooklying hip abduction isometric 8x5 sec hold       Manual Therapy   Manual Therapy Joint mobilization;Soft tissue mobilization    Joint Mobilization CPAs Grade III-IV T8 to S1 x2 bouts     Soft tissue mobilization STM Rt lateral  glutes; Trigger point release Rt piriformis             Trigger Point Dry Needling - 10/15/19 0001    Consent Given? Yes    Education Handout Provided Previously provided    Muscles Treated Back/Hip Gluteus medius;Gluteus minimus    Gluteus Minimus Response Twitch response elicited;Palpable increased muscle length   Rt   Gluteus Medius Response Twitch response elicited;Palpable increased muscle length   Rt               PT Education - 10/15/19 1709    Education Details adjustment to HEP    Person(s) Educated Patient    Methods Explanation    Comprehension Verbalized understanding            PT Short Term Goals - 10/10/19 1559      PT SHORT TERM GOAL #1   Title ind with initial HEP    Time 4    Period Weeks    Status New    Target Date 11/07/19      PT SHORT TERM GOAL #2   Title Pt will report 50% less pain at night when rolling over in  bed    Baseline pt sleeps in water bed    Time 4    Period Weeks    Status New    Target Date 11/07/19      PT SHORT TERM GOAL #3   Title Pt will demonstrate hip strength 5/5 without pain for improved functional transfers and walking    Time 4    Period Weeks    Status New    Target Date 11/07/19             PT Long Term Goals - 10/10/19 1600      PT LONG TERM GOAL #1   Title FOTO < or = to 33%    Time 8    Period Weeks    Status New    Target Date 12/05/19      PT LONG TERM GOAL #2   Title Pt will be able to perform sit to stand x 5 without UE support and 3/10 pain at most    Time 8    Period Weeks    Status New    Target Date 12/05/19      PT LONG TERM GOAL #3   Title Pt will be able to walk or ride recumbant bike for at least 30 minutes/day without increased pain    Time 8    Period Weeks    Status New    Target Date 12/05/19      PT LONG TERM GOAL #4   Title Pt will be ind with advanced HEP in order to maintain improved mobility, strength, and pain management    Time 8    Period Weeks    Status  New    Target Date 12/05/19                 Plan - 10/15/19 1710    Clinical Impression Statement Pt arrived with reports of worsening Rt buttock pain since the evaluation. PT reviewed his current HEP and made some adjustments to decrease pain with hip flexibility exercises. Pt has several tender points in the Rt gluteals and piriformis region. He was agreeable to dry needling with multiple twitch responses elicited in glute med/min. PT also performed lumbar and thoracic joint mobilization to increase spinal mobility. Pt denied increase in pain end of session. Will continue with current POC.    Personal Factors and Comorbidities Time since onset of injury/illness/exacerbation;Comorbidity 2    Comorbidities complications from COVID, spinal stenosis    Examination-Activity Limitations Transfers;Locomotion Level    Examination-Participation Restrictions Community Activity    Stability/Clinical Decision Making Evolving/Moderate complexity    Rehab Potential Excellent    PT Frequency 2x / week    PT Duration 8 weeks    PT Treatment/Interventions ADLs/Self Care Home Management;Biofeedback;Cryotherapy;Electrical Stimulation;Iontophoresis 4mg /ml Dexamethasone;Moist Heat;Traction;Ultrasound;Therapeutic activities;Therapeutic exercise;Neuromuscular re-education;Manual techniques;Patient/family education;Dry needling;Passive range of motion;Taping    PT Next Visit Plan DN piriformis-f/u on DN of glutes; soft tissue length of glutes and hamstrings STM and stretch; hip rotation mobs and long axis distraction; low thoracic P/A mobs; begin core strength if time allows    PT Home Exercise Plan Access Code: [email or web address]    Consulted and Agree with Plan of Care Patient           Patient will benefit from skilled therapeutic intervention in order to improve the following deficits and impairments:  Abnormal gait, Decreased range of motion, Difficulty walking, Increased fascial restricitons, Hypomobility,  Impaired flexibility, Decreased strength, Pain, Increased muscle spasms  Visit Diagnosis: Chronic  low back pain, unspecified back pain laterality, unspecified whether sciatica present  Pain in left hip  Pain in right hip  Stiffness of right hip, not elsewhere classified  Stiffness of left hip, not elsewhere classified     Problem List Patient Active Problem List   Diagnosis Date Noted  . Essential hypertension 03/09/2019  . Rosacea 01/17/2016  . Type 2 diabetes mellitus with hyperglycemia ([facility]) 08/30/2014  . Type 2 diabetes mellitus ([facility]) 07/06/2013  . Nicotine use disorder 04/16/2011  . Hyperlipidemia 02/26/2010   5:16 PM,10/15/19 Donita Brooks PT, DPT [facility]  [phone]  [facility] [address], [address] [place], [place], [address] Phone: [phone]   Fax:  [phone]  Name: THADDEAUS MONICA MRN: [id number] Date of Birth: Sep 22, 1950

## 2019-10-21 ENCOUNTER — Ambulatory Visit: Payer: Medicare Other | Admitting: Physical Therapy

## 2019-10-21 ENCOUNTER — Other Ambulatory Visit: Payer: Self-pay

## 2019-10-21 DIAGNOSIS — M25551 Pain in right hip: Secondary | ICD-10-CM

## 2019-10-21 DIAGNOSIS — M25652 Stiffness of left hip, not elsewhere classified: Secondary | ICD-10-CM

## 2019-10-21 DIAGNOSIS — M25552 Pain in left hip: Secondary | ICD-10-CM

## 2019-10-21 DIAGNOSIS — M25651 Stiffness of right hip, not elsewhere classified: Secondary | ICD-10-CM

## 2019-10-21 DIAGNOSIS — M545 Low back pain, unspecified: Secondary | ICD-10-CM

## 2019-10-21 NOTE — Patient Instructions (Signed)
Access Code: [email or web address] URL: [email or web address] Date: 10/21/2019 Prepared by: Dwana Curd  Exercises Supine Single Knee to Chest Stretch - 2 x daily - 7 x weekly - 5 reps - 1 sets - 10 sec hold Supine Hamstring Stretch - 1 x daily - 7 x weekly - 3 reps - 1 sets - 30 sec hold Supine Lower Trunk Rotation - 1 x daily - 7 x weekly - 10 reps - 1 sets - 5 sec hold Hooklying Isometric Hip Abduction with Belt - 1 x daily - 7 x weekly - 2 sets - 10 reps - 5 seconds hold Supine Hip Internal and External Rotation - 1 x daily - 7 x weekly - 1 sets - 15 reps Supine Diaphragmatic Breathing - 3 x daily - 7 x weekly - 10 reps - 1 sets Clamshell - 1 x daily - 7 x weekly - 3 sets - 10 reps  Patient Education Trigger Point Dry Needling

## 2019-10-21 NOTE — Therapy (Signed)
[facility] [address] [place], [place], [address] Phone: [phone]   Fax:  [phone]  Physical Therapy Treatment  Patient Details  Name: Luis Ross MRN: [id number] Date of Birth: 02-25-1951 Referring Provider (PT): Kristian Covey, MD   Encounter Date: 10/21/2019   PT End of Session - 10/21/19 1530    Visit Number 3    Date for PT Re-Evaluation 12/05/19    PT Start Time 1445    PT Stop Time 1530    PT Time Calculation (min) 45 min    Activity Tolerance Patient tolerated treatment well    Behavior During Therapy [facility] for tasks assessed/performed           Past Medical History:  Diagnosis Date  . HYPERLIPIDEMIA 02/26/2010    Past Surgical History:  Procedure Laterality Date  . GANGLION CYST EXCISION  1964   left wrist    There were no vitals filed for this visit.   Subjective Assessment - 10/21/19 1453    Subjective Pt states that he felt the best he has felt today.  Felt better in the glutes and low    Currently in Pain? No/denies    Multiple Pain Sites No                             [facility] Adult PT Treatment/Exercise - 10/21/19 0001      Neuro Re-ed    Neuro Re-ed Details  diaphragmatic breathing with reducing accessory muscle usage - work on ribcage excursion      Knee/Hip Exercises: Standing   Other Standing Knee Exercises isometric TrA with fwd stepping yellow band    Other Standing Knee Exercises standing against wall - pelvic tilt, diaphragmatic breathing      Knee/Hip Exercises: Supine   Other Supine Knee/Hip Exercises clam with red band - 20x      Knee/Hip Exercises: Sidelying   Clams 10 x each side - cues to keep from rolling back      Manual Therapy   Manual Therapy Joint mobilization;Soft tissue mobilization    Joint Mobilization CPAs Grade III-IV T8 to S1 x2 bouts     Soft tissue mobilization STM Rt lateral glutes; Trigger point release Rt  piriformis             Trigger Point Dry Needling - 10/21/19 0001    Consent Given? Yes    Education Handout Provided Previously provided    Muscles Treated Back/Hip Gluteus medius;Gluteus minimus    Gluteus Minimus Response Twitch response elicited;Palpable increased muscle length    Gluteus Medius Response Twitch response elicited;Palpable increased muscle length                PT Education - 10/21/19 1558    Education Details Access Code: [email or web address]    Person(s) Educated Patient    Methods Explanation;Demonstration;Handout;Verbal cues    Comprehension Verbalized understanding;Returned demonstration            PT Short Term Goals - 10/10/19 1559      PT SHORT TERM GOAL #1   Title ind with initial HEP    Time 4    Period Weeks    Status New    Target Date 11/07/19      PT SHORT TERM GOAL #2   Title Pt will report 50% less pain at night when rolling over in bed    Baseline pt sleeps in water  bed    Time 4    Period Weeks    Status New    Target Date 11/07/19      PT SHORT TERM GOAL #3   Title Pt will demonstrate hip strength 5/5 without pain for improved functional transfers and walking    Time 4    Period Weeks    Status New    Target Date 11/07/19             PT Long Term Goals - 10/10/19 1600      PT LONG TERM GOAL #1   Title FOTO < or = to 33%    Time 8    Period Weeks    Status New    Target Date 12/05/19      PT LONG TERM GOAL #2   Title Pt will be able to perform sit to stand x 5 without UE support and 3/10 pain at most    Time 8    Period Weeks    Status New    Target Date 12/05/19      PT LONG TERM GOAL #3   Title Pt will be able to walk or ride recumbant bike for at least 30 minutes/day without increased pain    Time 8    Period Weeks    Status New    Target Date 12/05/19      PT LONG TERM GOAL #4   Title Pt will be ind with advanced HEP in order to maintain improved mobility, strength, and pain management    Time 8     Period Weeks    Status New    Target Date 12/05/19                 Plan - 10/21/19 1559    Clinical Impression Statement Today's session focused on continued lengthening of Rt gluteals, improved thoracic extension and improved hip mobility.  Pt responded well to hands on techniques and had an audible adjustment at T8-10 with CPA mobs.  Pt felt no hip pain when standing at the end of treatment today.  He was able to begin basic core strengthening.  Pt was educated in adding diaphragmatic breathing for greater ribcage mobility and improved abdominal activation.    PT Treatment/Interventions ADLs/Self Care Home Management;Biofeedback;Cryotherapy;Electrical Stimulation;Iontophoresis 4mg /ml Dexamethasone;Moist Heat;Traction;Ultrasound;Therapeutic activities;Therapeutic exercise;Neuromuscular re-education;Manual techniques;Patient/family education;Dry needling;Passive range of motion;Taping           Patient will benefit from skilled therapeutic intervention in order to improve the following deficits and impairments:  Abnormal gait, Decreased range of motion, Difficulty walking, Increased fascial restricitons, Hypomobility, Impaired flexibility, Decreased strength, Pain, Increased muscle spasms  Visit Diagnosis: Chronic low back pain, unspecified back pain laterality, unspecified whether sciatica present  Pain in left hip  Pain in right hip  Stiffness of right hip, not elsewhere classified  Stiffness of left hip, not elsewhere classified     Problem List Patient Active Problem List   Diagnosis Date Noted  . Essential hypertension 03/09/2019  . Rosacea 01/17/2016  . Type 2 diabetes mellitus with hyperglycemia ([facility]) 08/30/2014  . Type 2 diabetes mellitus ([facility]) 07/06/2013  . Nicotine use disorder 04/16/2011  . Hyperlipidemia 02/26/2010    13/07/2009, PT 10/21/2019, 5:17 PM  [facility] [address], [address] [place], [place], [place] Phone: [phone]   Fax:  [phone]  Name: Luis Ross MRN: [name] Date of Birth: [birth year]

## 2019-10-29 ENCOUNTER — Encounter: Payer: Self-pay | Admitting: Physical Therapy

## 2019-10-30 ENCOUNTER — Ambulatory Visit: Payer: Medicare Other | Attending: Family Medicine | Admitting: Physical Therapy

## 2019-10-30 ENCOUNTER — Encounter: Payer: Self-pay | Admitting: Physical Therapy

## 2019-10-30 ENCOUNTER — Other Ambulatory Visit: Payer: Self-pay

## 2019-10-30 DIAGNOSIS — M545 Low back pain: Secondary | ICD-10-CM | POA: Insufficient documentation

## 2019-10-30 DIAGNOSIS — M25652 Stiffness of left hip, not elsewhere classified: Secondary | ICD-10-CM | POA: Diagnosis present

## 2019-10-30 DIAGNOSIS — M25551 Pain in right hip: Secondary | ICD-10-CM | POA: Diagnosis present

## 2019-10-30 DIAGNOSIS — M25552 Pain in left hip: Secondary | ICD-10-CM | POA: Diagnosis present

## 2019-10-30 DIAGNOSIS — M25651 Stiffness of right hip, not elsewhere classified: Secondary | ICD-10-CM | POA: Diagnosis present

## 2019-10-30 DIAGNOSIS — G8929 Other chronic pain: Secondary | ICD-10-CM | POA: Diagnosis present

## 2019-10-30 NOTE — Therapy (Signed)
[facility] [address] [place], [place], [address] Phone: [phone]   Fax:  [phone]  Physical Therapy Treatment  Patient Details  Name: Luis Ross MRN: [id number] Date of Birth: [birth year] Referring Provider (PT): Kristian Covey, MD   Encounter Date: 10/30/2019   PT End of Session - 10/30/19 1319    Visit Number 4    Date for PT Re-Evaluation 12/05/19    PT Start Time 1313    PT Stop Time 1355    PT Time Calculation (min) 42 min    Activity Tolerance Patient tolerated treatment well    Behavior During Therapy [facility] for tasks assessed/performed           Past Medical History:  Diagnosis Date  . HYPERLIPIDEMIA 02/26/2010    Past Surgical History:  Procedure Laterality Date  . GANGLION CYST EXCISION  1964   left wrist    There were no vitals filed for this visit.   Subjective Assessment - 10/30/19 1402    Subjective Pt states feeling better, walked several miles.  The only problem is getting up after sitting for a long time pain in bilat quads.    Currently in Pain? No/denies                             [facility] Adult PT Treatment/Exercise - 10/30/19 0001      Knee/Hip Exercises: Stretches   Lobbyist Right;Left;3 reps;20 seconds    Quad Stretch Limitations prone with strap and standing       Knee/Hip Exercises: Aerobic   Nustep L5 x 8 min - warm up and status update      Knee/Hip Exercises: Prone   Other Prone Exercises quadruped cat cow and UE reaches with VC and TC to activate TrA and for correct spine movements - 10x each      Manual Therapy   Soft tissue mobilization bilateal quadricepts            Trigger Point Dry Needling - 10/30/19 0001    Consent Given? Yes    Education Handout Provided Previously provided    Muscles Treated Lower Quadrant Quadriceps    Quadriceps Response Twitch response elicited;Palpable increased muscle length   bilateral                 PT Education - 10/30/19 1359    Education Details Access Code: [email or web address] added quad stretch; cat cow; qped arm reaches    Person(s) Educated Patient    Methods Explanation;Demonstration    Comprehension Returned demonstration;Verbalized understanding            PT Short Term Goals - 10/10/19 1559      PT SHORT TERM GOAL #1   Title ind with initial HEP    Time 4    Period Weeks    Status New    Target Date 11/07/19      PT SHORT TERM GOAL #2   Title Pt will report 50% less pain at night when rolling over in bed    Baseline pt sleeps in water bed    Time 4    Period Weeks    Status New    Target Date 11/07/19      PT SHORT TERM GOAL #3   Title Pt will demonstrate hip strength 5/5 without pain for improved functional transfers and walking    Time 4    Period Weeks  Status New    Target Date 11/07/19             PT Long Term Goals - 10/10/19 1600      PT LONG TERM GOAL #1   Title FOTO < or = to 33%    Time 8    Period Weeks    Status New    Target Date 12/05/19      PT LONG TERM GOAL #2   Title Pt will be able to perform sit to stand x 5 without UE support and 3/10 pain at most    Time 8    Period Weeks    Status New    Target Date 12/05/19      PT LONG TERM GOAL #3   Title Pt will be able to walk or ride recumbant bike for at least 30 minutes/day without increased pain    Time 8    Period Weeks    Status New    Target Date 12/05/19      PT LONG TERM GOAL #4   Title Pt will be ind with advanced HEP in order to maintain improved mobility, strength, and pain management    Time 8    Period Weeks    Status New    Target Date 12/05/19                 Plan - 10/30/19 1359    Clinical Impression Statement Pt responded well to dry needle and STM to bilateral quads.  Had release biltarally and twitch response with more twitches on Rt than Lt. Pt was able to progress his core strength and spinal mobilty with quadruped exercises.  He  will benefit from skilled PT to continue dry needling as needed for release of soft tissue adhesions, spinal mobility and core strengthening progressions.    PT Treatment/Interventions ADLs/Self Care Home Management;Biofeedback;Cryotherapy;Electrical Stimulation;Iontophoresis 4mg /ml Dexamethasone;Moist Heat;Traction;Ultrasound;Therapeutic activities;Therapeutic exercise;Neuromuscular re-education;Manual techniques;Patient/family education;Dry needling;Passive range of motion;Taping    PT Next Visit Plan quad and hamstrings stretches; cat cow review; progress core strength standing; pallof; qped variations;    PT Home Exercise Plan Access Code: [email or web address]    Consulted and Agree with Plan of Care Patient           Patient will benefit from skilled therapeutic intervention in order to improve the following deficits and impairments:  Abnormal gait, Decreased range of motion, Difficulty walking, Increased fascial restricitons, Hypomobility, Impaired flexibility, Decreased strength, Pain, Increased muscle spasms  Visit Diagnosis: Chronic low back pain, unspecified back pain laterality, unspecified whether sciatica present  Pain in left hip  Pain in right hip  Stiffness of right hip, not elsewhere classified  Stiffness of left hip, not elsewhere classified     Problem List Patient Active Problem List   Diagnosis Date Noted  . Essential hypertension 03/09/2019  . Rosacea 01/17/2016  . Type 2 diabetes mellitus with hyperglycemia (HCC) 08/30/2014  . Type 2 diabetes mellitus (HCC) 07/06/2013  . Nicotine use disorder 04/16/2011  . Hyperlipidemia 02/26/2010    13/07/2009, PT 10/30/2019, 2:15 PM  [facility] [address] [place], [place], [place] Phone: [phone]   Fax:  [phone]  Name: Luis Ross MRN: [name] Date of Birth: 1950-06-26

## 2019-10-30 NOTE — Patient Instructions (Signed)
Access Code: [email or web address] URL: [email or web address] Date: 10/30/2019 Prepared by: Dwana Curd  Exercises Supine Single Knee to Chest Stretch - 2 x daily - 7 x weekly - 5 reps - 1 sets - 10 sec hold Supine Hamstring Stretch - 1 x daily - 7 x weekly - 3 reps - 1 sets - 30 sec hold Supine Lower Trunk Rotation - 1 x daily - 7 x weekly - 10 reps - 1 sets - 5 sec hold Hooklying Isometric Hip Abduction with Belt - 1 x daily - 7 x weekly - 2 sets - 10 reps - 5 seconds hold Supine Hip Internal and External Rotation - 1 x daily - 7 x weekly - 1 sets - 15 reps Supine Diaphragmatic Breathing - 3 x daily - 7 x weekly - 10 reps - 1 sets Clamshell - 1 x daily - 7 x weekly - 3 sets - 10 reps Quadruped Alternating Arm Lift - 1 x daily - 7 x weekly - 10 reps - 2 sets Quadruped Cat Camel - 1 x daily - 7 x weekly - 3 sets - 10 reps Standing Quadriceps Stretch - 1 x daily - 7 x weekly - 3 sets - 1 reps - 30 sec hold  Patient Education Trigger Point Dry Needling

## 2019-11-05 ENCOUNTER — Other Ambulatory Visit: Payer: Self-pay

## 2019-11-05 ENCOUNTER — Encounter: Payer: Self-pay | Admitting: Physical Therapy

## 2019-11-05 ENCOUNTER — Ambulatory Visit: Payer: Medicare Other | Admitting: Physical Therapy

## 2019-11-05 DIAGNOSIS — M545 Low back pain, unspecified: Secondary | ICD-10-CM

## 2019-11-05 DIAGNOSIS — M25551 Pain in right hip: Secondary | ICD-10-CM

## 2019-11-05 DIAGNOSIS — M25651 Stiffness of right hip, not elsewhere classified: Secondary | ICD-10-CM

## 2019-11-05 NOTE — Patient Instructions (Signed)
Access Code: [email or web address] URL: [email or web address] Date: 11/05/2019 Prepared by: Lavinia Sharps  Exercises Supine Single Knee to Chest Stretch - 2 x daily - 7 x weekly - 5 reps - 1 sets - 10 sec hold Supine Hamstring Stretch - 1 x daily - 7 x weekly - 3 reps - 1 sets - 30 sec hold Supine Lower Trunk Rotation - 1 x daily - 7 x weekly - 10 reps - 1 sets - 5 sec hold Hooklying Isometric Hip Abduction with Belt - 1 x daily - 7 x weekly - 2 sets - 10 reps - 5 seconds hold Supine Hip Internal and External Rotation - 1 x daily - 7 x weekly - 1 sets - 15 reps Supine Diaphragmatic Breathing - 3 x daily - 7 x weekly - 10 reps - 1 sets Clamshell - 1 x daily - 7 x weekly - 3 sets - 10 reps Quadruped Alternating Arm Lift - 1 x daily - 7 x weekly - 10 reps - 2 sets Quadruped Cat Camel - 1 x daily - 7 x weekly - 3 sets - 10 reps Standing Quadriceps Stretch - 1 x daily - 7 x weekly - 3 sets - 1 reps - 30 sec hold Hooklying Isometric Hip Flexion - 1 x daily - 7 x weekly - 1 sets - 10 reps - 5 hold Supine Bent Leg Lift with Knee Extension - 1 x daily - 7 x weekly - 3 sets - 10 reps Supine 90/90 Shoulder Flexion with Abdominal Bracing - 1 x daily - 7 x weekly - 1 sets - 10 reps  Patient Education Trigger Point Dry Needling

## 2019-11-05 NOTE — Therapy (Signed)
[facility] [address], [address] [place], [place], [address] Phone: [phone]   Fax:  [phone]  Physical Therapy Treatment  Patient Details  Name: Luis Ross MRN: [id number] Date of Birth: Jul 25, 1950 Referring Provider (PT): Kristian Covey, MD   Encounter Date: 11/05/2019   PT End of Session - 11/05/19 1640    Visit Number 5    Date for PT Re-Evaluation 12/05/19    PT Start Time 0930    PT Stop Time 1020    PT Time Calculation (min) 50 min    Activity Tolerance Patient tolerated treatment well           Past Medical History:  Diagnosis Date  . HYPERLIPIDEMIA 02/26/2010    Past Surgical History:  Procedure Laterality Date  . GANGLION CYST EXCISION  1964   left wrist    There were no vitals filed for this visit.   Subjective Assessment - 11/05/19 0929    Subjective I was really sore for about a week but then yesterday it felt great.  The best it has felt yet.  I walked 2 miles.  When I change positions lying to sitting or sitting to standing right > left hips.  My back is 90% better than it was.  I"m a restless sleeper and I feel it turning over.    Currently in Pain? No/denies  With movement the pain is moderate 6/10                            [facility] Adult PT Treatment/Exercise - 11/05/19 0001      Lumbar Exercises: Supine   Ab Set 10 reps    AB Set Limitations with UEs on ball     Bent Knee Raise 10 reps    Isometric Hip Flexion 10 reps    Isometric Hip Flexion Limitations with and without red ball     Other Supine Lumbar Exercises increase resistance to green band with supine clams single leg variation 10x right/left       Knee/Hip Exercises: Stretches   Other Knee/Hip Stretches review of HEP stretches      Knee/Hip Exercises: Aerobic   Nustep L5 x 8 min - warm up and status update      Moist Heat Therapy   Number Minutes Moist Heat 5 Minutes    Moist Heat Location Hip       Manual Therapy   Manual therapy comments piriformis contract relax 3x 5 sec holds     Joint Mobilization right sidelying neutral gapping; right hip long axis distraction, inferior, AP in internal rotation grade 3 30 sec ea 3x     Soft tissue mobilization right gluteals and piriformis             Trigger Point Dry Needling - 11/05/19 0001    Consent Given? Yes    Muscles Treated Back/Hip Gluteus maximus;Piriformis    Other Dry Needling right only    Gluteus Minimus Response Twitch response elicited;Palpable increased muscle length    Gluteus Medius Response Twitch response elicited;Palpable increased muscle length    Gluteus Maximus Response Twitch response elicited;Palpable increased muscle length    Piriformis Response Twitch response elicited;Palpable increased muscle length                PT Education - 11/05/19 1639    Education Details supine ab brace with hip isometric; supine bent knee raises  Person(s) Educated Patient    Methods Explanation;Demonstration;Handout    Comprehension Returned demonstration;Verbalized understanding            PT Short Term Goals - 10/10/19 1559      PT SHORT TERM GOAL #1   Title ind with initial HEP    Time 4    Period Weeks    Status New    Target Date 11/07/19      PT SHORT TERM GOAL #2   Title Pt will report 50% less pain at night when rolling over in bed    Baseline pt sleeps in water bed    Time 4    Period Weeks    Status New    Target Date 11/07/19      PT SHORT TERM GOAL #3   Title Pt will demonstrate hip strength 5/5 without pain for improved functional transfers and walking    Time 4    Period Weeks    Status New    Target Date 11/07/19             PT Long Term Goals - 10/10/19 1600      PT LONG TERM GOAL #1   Title FOTO < or = to 33%    Time 8    Period Weeks    Status New    Target Date 12/05/19      PT LONG TERM GOAL #2   Title Pt will be able to perform sit to stand x 5 without UE  support and 3/10 pain at most    Time 8    Period Weeks    Status New    Target Date 12/05/19      PT LONG TERM GOAL #3   Title Pt will be able to walk or ride recumbant bike for at least 30 minutes/day without increased pain    Time 8    Period Weeks    Status New    Target Date 12/05/19      PT LONG TERM GOAL #4   Title Pt will be ind with advanced HEP in order to maintain improved mobility, strength, and pain management    Time 8    Period Weeks    Status New    Target Date 12/05/19                 Plan - 11/05/19 1013    Clinical Impression Statement The patient demonstrates good compliance with current HEP.  Despite extended period of soreness, he feels DN and manual therapy have been very helpful in relieving pain.  Pain is still present with transitional movements but lower in intensity.  Progressive lumbo/pelvic/hip strengthening should also help with transitional movements.  Verbal cues needed for transverse abdominus activation and to avoid holding his breath.  Decreased tender point size and number as well as joint mobility  following DN and manual therapy.    Comorbidities complications from COVID, spinal stenosis    Rehab Potential Excellent    PT Frequency 2x / week    PT Duration 8 weeks    PT Treatment/Interventions ADLs/Self Care Home Management;Biofeedback;Cryotherapy;Electrical Stimulation;Iontophoresis 4mg /ml Dexamethasone;Moist Heat;Traction;Ultrasound;Therapeutic activities;Therapeutic exercise;Neuromuscular re-education;Manual techniques;Patient/family education;Dry needling;Passive range of motion;Taping    PT Next Visit Plan check STGs next visit;  DN as needed to gluteals, piriformis as needed; progress core strength standing; pallof; qped variations;    PT Home Exercise Plan Access Code:           Patient will benefit  from skilled therapeutic intervention in order to improve the following deficits and impairments:  Abnormal gait,  Decreased range of motion, Difficulty walking, Increased fascial restricitons, Hypomobility, Impaired flexibility, Decreased strength, Pain, Increased muscle spasms  Visit Diagnosis: Chronic low back pain, unspecified back pain laterality, unspecified whether sciatica present  Pain in right hip  Stiffness of right hip, not elsewhere classified     Problem List Patient Active Problem List   Diagnosis Date Noted  . Essential hypertension 03/09/2019  . Rosacea 01/17/2016  . Type 2 diabetes mellitus with hyperglycemia (HCC) 08/30/2014  . Type 2 diabetes mellitus (HCC) 07/06/2013  . Nicotine use disorder 04/16/2011  . Hyperlipidemia 02/26/2010   Lavinia Sharps, PT 11/05/19 4:46 PM Phone: [phone] Fax: [phone] Vivien Presto 11/05/2019, 4:45 PM  [facility] [address], [address] [place], [place], [address] Phone: [phone]   Fax:  [phone]  Name: BRENDAN GADSON MRN: [id number] Date of Birth: 09-17-1950

## 2019-11-07 ENCOUNTER — Ambulatory Visit: Payer: Medicare Other | Admitting: Physical Therapy

## 2019-11-07 ENCOUNTER — Encounter: Payer: Self-pay | Admitting: Physical Therapy

## 2019-11-07 ENCOUNTER — Other Ambulatory Visit: Payer: Self-pay

## 2019-11-07 DIAGNOSIS — M25651 Stiffness of right hip, not elsewhere classified: Secondary | ICD-10-CM

## 2019-11-07 DIAGNOSIS — M25652 Stiffness of left hip, not elsewhere classified: Secondary | ICD-10-CM

## 2019-11-07 DIAGNOSIS — M25552 Pain in left hip: Secondary | ICD-10-CM

## 2019-11-07 DIAGNOSIS — M25551 Pain in right hip: Secondary | ICD-10-CM

## 2019-11-07 DIAGNOSIS — M545 Low back pain: Secondary | ICD-10-CM | POA: Diagnosis not present

## 2019-11-07 DIAGNOSIS — G8929 Other chronic pain: Secondary | ICD-10-CM

## 2019-11-07 NOTE — Therapy (Signed)
[facility] [address] [place], [place], [address] Phone: [phone]   Fax:  [phone]  Physical Therapy Treatment  Patient Details  Name: Luis Ross MRN: [id number] Date of Birth: Apr 08, 1951 Referring Provider (PT): Kristian Covey, MD   Encounter Date: 11/07/2019   PT End of Session - 11/07/19 0806    Visit Number 6    Date for PT Re-Evaluation 12/05/19    PT Start Time 0800    PT Stop Time 0843    PT Time Calculation (min) 43 min    Activity Tolerance Patient tolerated treatment well    Behavior During Therapy [facility] for tasks assessed/performed           Past Medical History:  Diagnosis Date  . HYPERLIPIDEMIA 02/26/2010    Past Surgical History:  Procedure Laterality Date  . GANGLION CYST EXCISION  1964   left wrist    There were no vitals filed for this visit.   Subjective Assessment - 11/07/19 0805    Subjective I am still sore from the needling last visit, but not too bad.  Reports rolling in bed is 70% improved    Currently in Pain? No/denies   5/10 when changing positions                            [facility] Adult PT Treatment/Exercise - 11/07/19 0001      Lumbar Exercises: Supine   Clam 15 reps    Clam Limitations green    Bridge 15 reps    Bridge Limitations green band around knees      Knee/Hip Exercises: Aerobic   Nustep L5 x 8 min - warm up and status update      Knee/Hip Exercises: Standing   Forward Step Up Right;Left;2 sets;10 reps;Step Height: 4";Step Height: 6"   slow lowering   Other Standing Knee Exercises hip hinge with dowel    Other Standing Knee Exercises sit to stand from foam pad on table - 10x with hip hinge      Knee/Hip Exercises: Sidelying   Hip ABduction Strengthening;10 reps                    PT Short Term Goals - 11/07/19 1016      PT SHORT TERM GOAL #1   Title ind with initial HEP    Status Achieved      PT SHORT  TERM GOAL #2   Title Pt will report 50% less pain at night when rolling over in bed    Baseline 70% less pain      PT SHORT TERM GOAL #3   Title Pt will demonstrate hip strength 5/5 without pain for improved functional transfers and walking    Status On-going             PT Long Term Goals - 10/10/19 1600      PT LONG TERM GOAL #1   Title FOTO < or = to 33%    Time 8    Period Weeks    Status New    Target Date 12/05/19      PT LONG TERM GOAL #2   Title Pt will be able to perform sit to stand x 5 without UE support and 3/10 pain at most    Time 8    Period Weeks    Status New    Target Date 12/05/19  PT LONG TERM GOAL #3   Title Pt will be able to walk or ride recumbant bike for at least 30 minutes/day without increased pain    Time 8    Period Weeks    Status New    Target Date 12/05/19      PT LONG TERM GOAL #4   Title Pt will be ind with advanced HEP in order to maintain improved mobility, strength, and pain management    Time 8    Period Weeks    Status New    Target Date 12/05/19                 Plan - 11/07/19 1013    Clinical Impression Statement Today's session with focus on neutral spine during functional movements.  He did well using a dowel for hip hinge to keep back straight as he tends to flex a lot throughout the mid to lower thoracic spine and has a lot of anterior weight shift.  pt did well with exercises today and no increased pain reported  Pt demonstrates improved thoracic mobility during exercises when cued to exhale with movement.    PT Treatment/Interventions ADLs/Self Care Home Management;Biofeedback;Cryotherapy;Electrical Stimulation;Iontophoresis 4mg /ml Dexamethasone;Moist Heat;Traction;Ultrasound;Therapeutic activities;Therapeutic exercise;Neuromuscular re-education;Manual techniques;Patient/family education;Dry needling;Passive range of motion;Taping    PT Next Visit Plan check STGs next visit;  DN as needed to gluteals, piriformis  as needed; progress core strength standing; pallof; qped variations;           Patient will benefit from skilled therapeutic intervention in order to improve the following deficits and impairments:  Abnormal gait, Decreased range of motion, Difficulty walking, Increased fascial restricitons, Hypomobility, Impaired flexibility, Decreased strength, Pain, Increased muscle spasms  Visit Diagnosis: Chronic low back pain, unspecified back pain laterality, unspecified whether sciatica present  Pain in right hip  Stiffness of right hip, not elsewhere classified  Pain in left hip  Stiffness of left hip, not elsewhere classified     Problem List Patient Active Problem List   Diagnosis Date Noted  . Essential hypertension 03/09/2019  . Rosacea 01/17/2016  . Type 2 diabetes mellitus with hyperglycemia (HCC) 08/30/2014  . Type 2 diabetes mellitus (HCC) 07/06/2013  . Nicotine use disorder 04/16/2011  . Hyperlipidemia 02/26/2010    13/07/2009, PT 11/07/2019, 10:17 AM  [facility] [address] [place], [place], [place] Phone: [phone]   Fax:  [phone]  Name: Luis Ross MRN: [name] Date of Birth: 1950-05-14

## 2019-11-12 ENCOUNTER — Other Ambulatory Visit: Payer: Self-pay

## 2019-11-12 ENCOUNTER — Ambulatory Visit: Payer: Medicare Other | Admitting: Physical Therapy

## 2019-11-12 ENCOUNTER — Encounter: Payer: Self-pay | Admitting: Physical Therapy

## 2019-11-12 DIAGNOSIS — M25552 Pain in left hip: Secondary | ICD-10-CM

## 2019-11-12 DIAGNOSIS — M25652 Stiffness of left hip, not elsewhere classified: Secondary | ICD-10-CM

## 2019-11-12 DIAGNOSIS — M25651 Stiffness of right hip, not elsewhere classified: Secondary | ICD-10-CM

## 2019-11-12 DIAGNOSIS — M545 Low back pain, unspecified: Secondary | ICD-10-CM

## 2019-11-12 DIAGNOSIS — G8929 Other chronic pain: Secondary | ICD-10-CM

## 2019-11-12 DIAGNOSIS — M25551 Pain in right hip: Secondary | ICD-10-CM

## 2019-11-12 NOTE — Therapy (Signed)
[facility] [address] [place], [place], [address] Phone: [phone]   Fax:  [phone]  Physical Therapy Treatment  Patient Details  Name: Luis Ross MRN: [id number] Date of Birth: 09/13/1950 Referring Provider (PT): Kristian Covey, MD   Encounter Date: 11/12/2019   PT End of Session - 11/12/19 0810    Visit Number 7    Date for PT Re-Evaluation 12/05/19    PT Start Time 0802    PT Stop Time 0842    PT Time Calculation (min) 40 min    Activity Tolerance Patient tolerated treatment well    Behavior During Therapy [facility] for tasks assessed/performed           Past Medical History:  Diagnosis Date  . HYPERLIPIDEMIA 02/26/2010    Past Surgical History:  Procedure Laterality Date  . GANGLION CYST EXCISION  1964   left wrist    There were no vitals filed for this visit.   Subjective Assessment - 11/12/19 0805    Subjective I have been walking 2 miles/day and that is okay.  I am still having difficutly getting up and down and that is the main issue still remaining and it is 5/10 difficulty    Limitations Walking;Standing;House hold activities    Patient Stated Goals 3 mile/day walking and have    Currently in Pain? No/denies                             [facility] Adult PT Treatment/Exercise - 11/12/19 0001      Lumbar Exercises: Supine   Clam 15 reps    Clam Limitations green    Bridge 15 reps    Bridge Limitations green band around knees      Knee/Hip Exercises: Aerobic   Nustep L5 x 10 min - warm up and status update   seat 11/UE 11     Knee/Hip Exercises: Machines for Strengthening   Total Gym Leg Press seat 9 110# x 20 bil; 55# single 10x2      Knee/Hip Exercises: Standing   Lateral Step Up Right;Left;10 reps;Step Height: 6"    Forward Step Up Right;Left;2 sets;10 reps;Step Height: 6"   slow lowering   Functional Squat 20 reps    Functional Squat Limitations holding  the railing for support - 20x cues to keep knees from coming forward     Other Standing Knee Exercises rotation with green band - 10x each side    Other Standing Knee Exercises sit to stand from foam pad on table - 10x with hip hinge and exhale for core activation      Knee/Hip Exercises: Sidelying   Hip ABduction Strengthening;10 reps                    PT Short Term Goals - 11/12/19 1031      PT SHORT TERM GOAL #1   Title ind with initial HEP    Status Achieved      PT SHORT TERM GOAL #2   Title Pt will report 50% less pain at night when rolling over in bed    Status Achieved      PT SHORT TERM GOAL #3   Title Pt will demonstrate hip strength 5/5 without pain for improved functional transfers and walking    Status On-going             PT Long Term Goals - 10/10/19  1600      PT LONG TERM GOAL #1   Title FOTO < or = to 33%    Time 8    Period Weeks    Status New    Target Date 12/05/19      PT LONG TERM GOAL #2   Title Pt will be able to perform sit to stand x 5 without UE support and 3/10 pain at most    Time 8    Period Weeks    Status New    Target Date 12/05/19      PT LONG TERM GOAL #3   Title Pt will be able to walk or ride recumbant bike for at least 30 minutes/day without increased pain    Time 8    Period Weeks    Status New    Target Date 12/05/19      PT LONG TERM GOAL #4   Title Pt will be ind with advanced HEP in order to maintain improved mobility, strength, and pain management    Time 8    Period Weeks    Status New    Target Date 12/05/19                 Plan - 11/12/19 6568    Clinical Impression Statement Pt is demonstrating improved hip hinging still needing cues for increased extension through spine and not bring knees anterior during the movement.  Pt has been able to continue walking for 2 miles/day without any problem.  Exercises that are gym based were added today.  Pt will benefit from skilled PT to improve core  and glute strength for functional movements.    PT Treatment/Interventions ADLs/Self Care Home Management;Biofeedback;Cryotherapy;Electrical Stimulation;Iontophoresis 4mg /ml Dexamethasone;Moist Heat;Traction;Ultrasound;Therapeutic activities;Therapeutic exercise;Neuromuscular re-education;Manual techniques;Patient/family education;Dry needling;Passive range of motion;Taping    PT Next Visit Plan check STGs next visit;  DN as needed to gluteals, piriformis as needed; progress core strength standing; pallof; qped variations;    PT Home Exercise Plan Access Code: [email or web address]    Consulted and Agree with Plan of Care Patient           Patient will benefit from skilled therapeutic intervention in order to improve the following deficits and impairments:  Abnormal gait, Decreased range of motion, Difficulty walking, Increased fascial restricitons, Hypomobility, Impaired flexibility, Decreased strength, Pain, Increased muscle spasms  Visit Diagnosis: Chronic low back pain, unspecified back pain laterality, unspecified whether sciatica present  Pain in right hip  Stiffness of right hip, not elsewhere classified  Pain in left hip  Stiffness of left hip, not elsewhere classified     Problem List Patient Active Problem List   Diagnosis Date Noted  . Essential hypertension 03/09/2019  . Rosacea 01/17/2016  . Type 2 diabetes mellitus with hyperglycemia (HCC) 08/30/2014  . Type 2 diabetes mellitus (HCC) 07/06/2013  . Nicotine use disorder 04/16/2011  . Hyperlipidemia 02/26/2010    13/07/2009, PT 11/12/2019, 10:32 AM  [facility] [address], [address] [place], [place], [place] Phone: [phone]   Fax:  [phone]  Name: Luis Ross MRN: [name] Date of Birth: May 31, 1950

## 2019-11-14 ENCOUNTER — Other Ambulatory Visit: Payer: Self-pay

## 2019-11-14 ENCOUNTER — Ambulatory Visit: Payer: Medicare Other | Admitting: Physical Therapy

## 2019-11-14 DIAGNOSIS — M25551 Pain in right hip: Secondary | ICD-10-CM

## 2019-11-14 DIAGNOSIS — M545 Low back pain: Secondary | ICD-10-CM | POA: Diagnosis not present

## 2019-11-14 DIAGNOSIS — M25652 Stiffness of left hip, not elsewhere classified: Secondary | ICD-10-CM

## 2019-11-14 DIAGNOSIS — G8929 Other chronic pain: Secondary | ICD-10-CM

## 2019-11-14 DIAGNOSIS — M25651 Stiffness of right hip, not elsewhere classified: Secondary | ICD-10-CM

## 2019-11-14 DIAGNOSIS — M25552 Pain in left hip: Secondary | ICD-10-CM

## 2019-11-14 NOTE — Therapy (Signed)
[facility] [address], [address] [place], [place], [address] Phone: [phone]   Fax:  [phone]  Physical Therapy Treatment  Patient Details  Name: Luis Ross MRN: [id number] Date of Birth: 1950/09/09 Referring Provider (PT): Kristian Covey, MD   Encounter Date: 11/14/2019   PT End of Session - 11/14/19 0957    Visit Number 8    Date for PT Re-Evaluation 12/05/19    PT Start Time 0847    PT Stop Time 0930    PT Time Calculation (min) 43 min    Activity Tolerance Patient tolerated treatment well    Behavior During Therapy [facility] for tasks assessed/performed           Past Medical History:  Diagnosis Date  . HYPERLIPIDEMIA 02/26/2010    Past Surgical History:  Procedure Laterality Date  . GANGLION CYST EXCISION  1964   left wrist    There were no vitals filed for this visit.   Subjective Assessment - 11/14/19 0849    Subjective Pt states he has felt about the same. He was able to walk 5 miles yesterday - it was broken up. Still some pain when turning in bed and getting up    Patient Stated Goals 3 mile/day walking and have    Currently in Pain? No/denies                             [facility] Adult PT Treatment/Exercise - 11/14/19 0001      Knee/Hip Exercises: Stretches   Passive Hamstring Stretch 1 rep;Right;Left;30 seconds   heavy TC to keep from flexing lumbar   Hip Flexor Stretch Right;Left;2 reps;30 seconds      Knee/Hip Exercises: Aerobic   Elliptical L5/I4 - PT present for status update -      Knee/Hip Exercises: Seated   Sit to Sand 10 reps;without UE support   elevated with band and ball squeeze variations     Manual Therapy   Soft tissue mobilization right gluteals and piriformis ; bil lumbar            Trigger Point Dry Needling - 11/14/19 0001    Consent Given? Yes    Education Handout Provided Previously provided    Muscles Treated Back/Hip Gluteus  maximus;Piriformis    Gluteus Minimus Response Twitch response elicited;Palpable increased muscle length    Gluteus Medius Response Twitch response elicited;Palpable increased muscle length    Gluteus Maximus Response Twitch response elicited;Palpable increased muscle length    Piriformis Response Twitch response elicited;Palpable increased muscle length                  PT Short Term Goals - 11/12/19 1031      PT SHORT TERM GOAL #1   Title ind with initial HEP    Status Achieved      PT SHORT TERM GOAL #2   Title Pt will report 50% less pain at night when rolling over in bed    Status Achieved      PT SHORT TERM GOAL #3   Title Pt will demonstrate hip strength 5/5 without pain for improved functional transfers and walking    Status On-going             PT Long Term Goals - 11/14/19 1021      PT LONG TERM GOAL #1   Title FOTO < or = to 33%    Status  On-going      PT LONG TERM GOAL #2   Title Pt will be able to perform sit to stand x 5 without UE support and 3/10 pain at most    Status On-going      PT LONG TERM GOAL #3   Title Pt will be able to walk or ride recumbant bike for at least 30 minutes/day without increased pain    Status On-going      PT LONG TERM GOAL #4   Title Pt will be ind with advanced HEP in order to maintain improved mobility, strength, and pain management    Status On-going                 Plan - 11/14/19 0959    Clinical Impression Statement Pt had good release with DN to Lt lumbar and Rt gluteals.  Rt lumbar was done also and had small release.  Pt did well with addition of band around knees for gluteal activation.  He feels good with lumbar extension stretch and needed cues to get more lumbar extension for h/s stretch.  Pt will benefit from skilled PT to continue to work on ability to maintain more neutral spine when doing functoinal activities.    PT Treatment/Interventions ADLs/Self Care Home  Management;Biofeedback;Cryotherapy;Electrical Stimulation;Iontophoresis 4mg /ml Dexamethasone;Moist Heat;Traction;Ultrasound;Therapeutic activities;Therapeutic exercise;Neuromuscular re-education;Manual techniques;Patient/family education;Dry needling;Passive range of motion;Taping           Patient will benefit from skilled therapeutic intervention in order to improve the following deficits and impairments:  Abnormal gait, Decreased range of motion, Difficulty walking, Increased fascial restricitons, Hypomobility, Impaired flexibility, Decreased strength, Pain, Increased muscle spasms  Visit Diagnosis: Chronic low back pain, unspecified back pain laterality, unspecified whether sciatica present  Pain in right hip  Stiffness of right hip, not elsewhere classified  Pain in left hip  Stiffness of left hip, not elsewhere classified     Problem List Patient Active Problem List   Diagnosis Date Noted  . Essential hypertension 03/09/2019  . Rosacea 01/17/2016  . Type 2 diabetes mellitus with hyperglycemia (HCC) 08/30/2014  . Type 2 diabetes mellitus (HCC) 07/06/2013  . Nicotine use disorder 04/16/2011  . Hyperlipidemia 02/26/2010    13/07/2009, PT 11/14/2019, 10:31 AM  [facility] [address] [place], [place], [place] Phone: [phone]   Fax:  [phone]  Name: Luis Ross MRN: [name] Date of Birth: [birth year]

## 2019-11-19 ENCOUNTER — Ambulatory Visit: Payer: Medicare Other | Admitting: Physical Therapy

## 2019-11-19 ENCOUNTER — Encounter: Payer: Self-pay | Admitting: Physical Therapy

## 2019-11-19 ENCOUNTER — Other Ambulatory Visit: Payer: Self-pay

## 2019-11-19 DIAGNOSIS — M25651 Stiffness of right hip, not elsewhere classified: Secondary | ICD-10-CM

## 2019-11-19 DIAGNOSIS — M25551 Pain in right hip: Secondary | ICD-10-CM

## 2019-11-19 DIAGNOSIS — M545 Low back pain, unspecified: Secondary | ICD-10-CM

## 2019-11-19 DIAGNOSIS — M25552 Pain in left hip: Secondary | ICD-10-CM

## 2019-11-19 DIAGNOSIS — G8929 Other chronic pain: Secondary | ICD-10-CM

## 2019-11-19 DIAGNOSIS — M25652 Stiffness of left hip, not elsewhere classified: Secondary | ICD-10-CM

## 2019-11-19 NOTE — Therapy (Signed)
[facility] [address] [place], [place], [address] Phone: [phone]   Fax:  [phone]  Physical Therapy Treatment  Patient Details  Name: Luis Ross MRN: [id number] Date of Birth: 01/18/1951 Referring Provider (PT): Kristian Covey, MD   Encounter Date: 11/19/2019   PT End of Session - 11/19/19 0847    Visit Number 9    Date for PT Re-Evaluation 12/05/19    PT Start Time 0759    PT Stop Time 0847    PT Time Calculation (min) 48 min    Activity Tolerance Patient tolerated treatment well    Behavior During Therapy [facility] for tasks assessed/performed           Past Medical History:  Diagnosis Date  . HYPERLIPIDEMIA 02/26/2010    Past Surgical History:  Procedure Laterality Date  . GANGLION CYST EXCISION  1964   left wrist    There were no vitals filed for this visit.   Subjective Assessment - 11/19/19 0802    Subjective Pt states the walking is okay.  Pt states the hip feels more sore and feels it in the back and front of the hip when doing the steps. Pt states the hip is about 3    Currently in Pain? No/denies                             [facility] Adult PT Treatment/Exercise - 11/19/19 0001      Lumbar Exercises: Supine   Other Supine Lumbar Exercises hip/knee 90 - tapping down LE; UE flex red band bil and small pulses - cues to reduce ribcage excursion and activate core      Knee/Hip Exercises: Stretches   Active Hamstring Stretch Right;Left;1 rep;30 seconds      Knee/Hip Exercises: Aerobic   Elliptical L5/I4 - PT present for status update -      Knee/Hip Exercises: Standing   Lateral Step Up Right;Left;10 reps;Step Height: 6"    Forward Step Up Right;Left;2 sets;10 reps;Step Height: 6"   slow lowering   Functional Squat 15 reps    Functional Squat Limitations holding railing - yellow loop with side step                    PT Short Term Goals -  11/12/19 1031      PT SHORT TERM GOAL #1   Title ind with initial HEP    Status Achieved      PT SHORT TERM GOAL #2   Title Pt will report 50% less pain at night when rolling over in bed    Status Achieved      PT SHORT TERM GOAL #3   Title Pt will demonstrate hip strength 5/5 without pain for improved functional transfers and walking    Status On-going             PT Long Term Goals - 11/14/19 1021      PT LONG TERM GOAL #1   Title FOTO < or = to 33%    Status On-going      PT LONG TERM GOAL #2   Title Pt will be able to perform sit to stand x 5 without UE support and 3/10 pain at most    Status On-going      PT LONG TERM GOAL #3   Title Pt will be able to walk or ride recumbant bike for at  least 30 minutes/day without increased pain    Status On-going      PT LONG TERM GOAL #4   Title Pt will be ind with advanced HEP in order to maintain improved mobility, strength, and pain management    Status On-going                 Plan - 11/19/19 1011    Clinical Impression Statement Pt did well with exercises with focus on gluteal strengthening.  He feels muscle fatigue with exercises but no lingering pain.  Pt was given updated HEP today.  Pt continues to need VC and TC as progressing to more challenging exercises.  He is demonstrating progress and did not have pain when doing step ups today. Pt will benefit from skilled PT to continue buidling strength for improved funcitonal activities and quality of life.    PT Treatment/Interventions ADLs/Self Care Home Management;Biofeedback;Cryotherapy;Electrical Stimulation;Iontophoresis 4mg /ml Dexamethasone;Moist Heat;Traction;Ultrasound;Therapeutic activities;Therapeutic exercise;Neuromuscular re-education;Manual techniques;Patient/family education;Dry needling;Passive range of motion;Taping    PT Next Visit Plan FOTO and MMT next; DN to gluteals, piriformis and lubmar multifidi    PT Home Exercise Plan Access Code: [email or web address]     Consulted and Agree with Plan of Care Patient           Patient will benefit from skilled therapeutic intervention in order to improve the following deficits and impairments:  Abnormal gait, Decreased range of motion, Difficulty walking, Increased fascial restricitons, Hypomobility, Impaired flexibility, Decreased strength, Pain, Increased muscle spasms  Visit Diagnosis: Chronic low back pain, unspecified back pain laterality, unspecified whether sciatica present  Pain in right hip  Stiffness of right hip, not elsewhere classified  Pain in left hip  Stiffness of left hip, not elsewhere classified     Problem List Patient Active Problem List   Diagnosis Date Noted  . Essential hypertension 03/09/2019  . Rosacea 01/17/2016  . Type 2 diabetes mellitus with hyperglycemia (HCC) 08/30/2014  . Type 2 diabetes mellitus (HCC) 07/06/2013  . Nicotine use disorder 04/16/2011  . Hyperlipidemia 02/26/2010    13/07/2009, PT 11/19/2019, 10:19 AM  [facility] [address], [address] [place], [place], [place] Phone: [phone]   Fax:  [phone]  Name: Luis Ross MRN: [name] Date of Birth: [birth year]

## 2019-11-21 ENCOUNTER — Ambulatory Visit: Payer: Medicare Other | Admitting: Physical Therapy

## 2019-11-21 ENCOUNTER — Other Ambulatory Visit: Payer: Self-pay

## 2019-11-21 DIAGNOSIS — M25551 Pain in right hip: Secondary | ICD-10-CM

## 2019-11-21 DIAGNOSIS — M25651 Stiffness of right hip, not elsewhere classified: Secondary | ICD-10-CM

## 2019-11-21 DIAGNOSIS — M25652 Stiffness of left hip, not elsewhere classified: Secondary | ICD-10-CM

## 2019-11-21 DIAGNOSIS — M545 Low back pain, unspecified: Secondary | ICD-10-CM

## 2019-11-21 DIAGNOSIS — M25552 Pain in left hip: Secondary | ICD-10-CM

## 2019-11-21 NOTE — Therapy (Signed)
[facility] [address], [place], [place], [address] Phone: [phone]   Fax:  [phone]  Physical Therapy Treatment Progress Note Reporting Period 10/10/19  to 11/21/19   See note below for Objective Data and Assessment of Progress/Goals.      Patient Details  Name: Luis Ross MRN: [id number] Date of Birth: [birth year] Referring Provider (PT): Eulas Post, MD   Encounter Date: 11/21/2019   PT End of Session - 11/21/19 0910    Visit Number 10    Date for PT Re-Evaluation 12/05/19    PT Start Time 0846    PT Stop Time 0930    PT Time Calculation (min) 44 min    Activity Tolerance Patient tolerated treatment well    Behavior During Therapy [facility] for tasks assessed/performed           Past Medical History:  Diagnosis Date  . HYPERLIPIDEMIA 02/26/2010    Past Surgical History:  Procedure Laterality Date  . GANGLION CYST EXCISION  1964   left wrist    There were no vitals filed for this visit.   Subjective Assessment - 11/21/19 0848    Subjective I feel better today than I have in a long time.  I feel overall 85% improved.  I could tell just moving in bed I was feeling better.    Patient Stated Goals 3 mile/day walking and have no pain    Currently in Pain? No/denies                             [facility] Adult PT Treatment/Exercise - 11/21/19 0001      Knee/Hip Exercises: Stretches   Passive Hamstring Stretch Right;Left;2 reps;20 seconds    ITB Stretch Right;Left;2 reps;20 seconds      Knee/Hip Exercises: Aerobic   Nustep L5 x 8 min - PT status update   seat 11/UE 11     Manual Therapy   Soft tissue mobilization right gluteals and piriformis ; bil lumbar         clam in supine blue loop 20x single leg   Trigger Point Dry Needling - 11/21/19 0001    Consent Given? Yes    Education Handout Provided Previously provided    Muscles Treated Back/Hip Lumbar multifidi      Other Dry Needling Rt on gluteals; bilat lumbar    Gluteus Minimus Response Twitch response elicited;Palpable increased muscle length    Gluteus Medius Response Twitch response elicited;Palpable increased muscle length    Gluteus Maximus Response Twitch response elicited;Palpable increased muscle length    Piriformis Response Twitch response elicited;Palpable increased muscle length    Lumbar multifidi Response Twitch response elicited;Palpable increased muscle length                  PT Short Term Goals - 11/12/19 1031      PT SHORT TERM GOAL #1   Title ind with initial HEP    Status Achieved      PT SHORT TERM GOAL #2   Title Pt will report 50% less pain at night when rolling over in bed    Status Achieved      PT SHORT TERM GOAL #3   Title Pt will demonstrate hip strength 5/5 without pain for improved functional transfers and walking    Status On-going             PT Long Term Goals - 11/21/19  [place] #1   Title FOTO < or = to 33%    Baseline 31% (11/21/19)    Status Achieved      PT LONG TERM GOAL #2   Title Pt will be able to perform sit to stand x 5 without UE support and 3/10 pain at most    Status On-going      PT LONG TERM GOAL #3   Title Pt will be able to walk or ride recumbant bike for at least 30 minutes/day without increased pain    Baseline gets a little more sore    Status Partially Met      PT LONG TERM GOAL #4   Title Pt will be ind with advanced HEP in order to maintain improved mobility, strength, and pain management    Status On-going                 Plan - 11/21/19 6440    Clinical Impression Statement Pt is feeling 85% better and was feeling very good this morning.  He has some soreness after the needling but not bad.  pt had good release especially on the R T12/L1 multifidi.  Pt did light strengthening and stretching to facilitate muscles that were treated with DN and STM for maximum improved outcomes from  treatment.  FOTO re-assessed and pt has met his goal with 31% limitation today.    PT Treatment/Interventions ADLs/Self Care Home Management;Biofeedback;Cryotherapy;Electrical Stimulation;Iontophoresis 45m/ml Dexamethasone;Moist Heat;Traction;Ultrasound;Therapeutic activities;Therapeutic exercise;Neuromuscular re-education;Manual techniques;Patient/family education;Dry needling;Passive range of motion;Taping    PT Next Visit Plan MMT and 5x sit to stand next; strength progression as able    PT Home Exercise Plan Access Code: [email or web address]    Consulted and Agree with Plan of Care Patient           Patient will benefit from skilled therapeutic intervention in order to improve the following deficits and impairments:  Abnormal gait, Decreased range of motion, Difficulty walking, Increased fascial restricitons, Hypomobility, Impaired flexibility, Decreased strength, Pain, Increased muscle spasms  Visit Diagnosis: Chronic low back pain, unspecified back pain laterality, unspecified whether sciatica present  Pain in right hip  Stiffness of right hip, not elsewhere classified  Pain in left hip  Stiffness of left hip, not elsewhere classified     Problem List Patient Active Problem List   Diagnosis Date Noted  . Essential hypertension 03/09/2019  . Rosacea 01/17/2016  . Type 2 diabetes mellitus with hyperglycemia ([place] 08/30/2014  . Type 2 diabetes mellitus ([place] 07/06/2013  . Nicotine use disorder 04/16/2011  . Hyperlipidemia 02/26/2010    JJule Ser PT 11/21/2019, 10:25 AM  [facility] [address] [place] [address]: [phone]  Fax:  [phone] Name: Luis SANGUINETTIMRN: [id number] of Birth: 09/08/1950-12-19

## 2019-11-25 NOTE — Addendum Note (Signed)
Addended by: Lerry Liner on: 11/25/2019 03:37 PM   Modules accepted: Orders

## 2019-11-26 ENCOUNTER — Ambulatory Visit: Payer: Medicare Other | Attending: Family Medicine | Admitting: Physical Therapy

## 2019-11-26 ENCOUNTER — Other Ambulatory Visit: Payer: Self-pay

## 2019-11-26 ENCOUNTER — Encounter: Payer: Self-pay | Admitting: Physical Therapy

## 2019-11-26 DIAGNOSIS — G8929 Other chronic pain: Secondary | ICD-10-CM | POA: Diagnosis present

## 2019-11-26 DIAGNOSIS — M25552 Pain in left hip: Secondary | ICD-10-CM | POA: Insufficient documentation

## 2019-11-26 DIAGNOSIS — M25651 Stiffness of right hip, not elsewhere classified: Secondary | ICD-10-CM

## 2019-11-26 DIAGNOSIS — M545 Low back pain: Secondary | ICD-10-CM | POA: Insufficient documentation

## 2019-11-26 DIAGNOSIS — M25652 Stiffness of left hip, not elsewhere classified: Secondary | ICD-10-CM | POA: Diagnosis present

## 2019-11-26 DIAGNOSIS — M25551 Pain in right hip: Secondary | ICD-10-CM | POA: Diagnosis present

## 2019-11-26 NOTE — Therapy (Signed)
[facility] [address] [place], [place], [address] Phone: [phone]   Fax:  [phone]  Physical Therapy Treatment  Patient Details  Name: Luis Ross MRN: [id number] Date of Birth: 1951-03-10 Referring Provider (PT): Eulas Post, MD   Encounter Date: 11/26/2019   PT End of Session - 11/26/19 0937    Visit Number 11    Date for PT Re-Evaluation 12/05/19    PT Start Time 0930    PT Stop Time 5003    PT Time Calculation (min) 45 min    Activity Tolerance Patient tolerated treatment well    Behavior During Therapy [facility] for tasks assessed/performed           Past Medical History:  Diagnosis Date  . HYPERLIPIDEMIA 02/26/2010    Past Surgical History:  Procedure Laterality Date  . GANGLION CYST EXCISION  1964   left wrist    There were no vitals filed for this visit.   Subjective Assessment - 11/26/19 0932    Subjective I feel sore from the needles last time.    Currently in Pain? No/denies              [facility] PT Assessment - 11/26/19 0001      Strength   Overall Strength Comments Rt hip flexion and abdcution +soreness                         [facility] Adult PT Treatment/Exercise - 11/26/19 0001      Knee/Hip Exercises: Aerobic   Nustep L5 x 10 min - PT status update   seat 11/UE 11     Knee/Hip Exercises: Machines for Strengthening   Total Gym Leg Press seat 9 120# x 20 bil; 60# single 10x2      Knee/Hip Exercises: Standing   SLS green band shoulder extension - 10x each; pallof press in tandem    Other Standing Knee Exercises UE flex and scap standing on foam mat      Knee/Hip Exercises: Seated   Sit to Sand 2 sets;10 reps;without UE support   one 5x in 10 sec; chair and low mat table                   PT Short Term Goals - 11/12/19 1031      PT SHORT TERM GOAL #1   Title ind with initial HEP    Status Achieved      PT SHORT TERM GOAL #2   Title Pt  will report 50% less pain at night when rolling over in bed    Status Achieved      PT SHORT TERM GOAL #3   Title Pt will demonstrate hip strength 5/5 without pain for improved functional transfers and walking    Status On-going             PT Long Term Goals - 11/26/19 0955      PT LONG TERM GOAL #2   Title Pt will be able to perform sit to stand x 5 without UE support and 3/10 pain at most    Baseline no pain in 10 sec    Status Achieved      PT LONG TERM GOAL #3   Title Pt will be able to walk or ride recumbant bike for at least 30 minutes/day without increased pain  Plan - 11/26/19 1052    Clinical Impression Statement Pt is doing much better overall.  he is sore from DN last time but this is typical for him.  Pt demonstrates sit to stand in 10 seconds from low mat table and normal chair and no UE use  Pt has met most goals at this time and 5/5 MMT bilat LE.  Still some pain with Rt hip abduction and flexion but may be due to soreness.  He was able to progress resistance and difficulty of exercises today.    PT Treatment/Interventions ADLs/Self Care Home Management;Biofeedback;Cryotherapy;Electrical Stimulation;Iontophoresis 68m/ml Dexamethasone;Moist Heat;Traction;Ultrasound;Therapeutic activities;Therapeutic exercise;Neuromuscular re-education;Manual techniques;Patient/family education;Dry needling;Passive range of motion;Taping    PT Next Visit Plan finalize HEP with single leg stability progressions    PT Home Exercise Plan Access Code: [email or web address]    Consulted and Agree with Plan of Care Patient           Patient will benefit from skilled therapeutic intervention in order to improve the following deficits and impairments:     Visit Diagnosis: Chronic low back pain, unspecified back pain laterality, unspecified whether sciatica present  Pain in right hip  Stiffness of right hip, not elsewhere classified  Pain in left hip  Stiffness of left hip,  not elsewhere classified     Problem List Patient Active Problem List   Diagnosis Date Noted  . Essential hypertension 03/09/2019  . Rosacea 01/17/2016  . Type 2 diabetes mellitus with hyperglycemia ([place] 08/30/2014  . Type 2 diabetes mellitus ([place] 07/06/2013  . Nicotine use disorder 04/16/2011  . Hyperlipidemia 02/26/2010    JJule Ser PT 11/26/2019, 11:01 AM   [facility] [address] [place] [address]Phone: [phone]  Fax:  [phone] Name: Luis CROCHETMRN: [id number] of Birth: 4November 24, 1952

## 2019-11-28 ENCOUNTER — Other Ambulatory Visit: Payer: Self-pay

## 2019-11-28 ENCOUNTER — Ambulatory Visit: Payer: Medicare Other | Admitting: Physical Therapy

## 2019-11-28 DIAGNOSIS — M25552 Pain in left hip: Secondary | ICD-10-CM

## 2019-11-28 DIAGNOSIS — M25651 Stiffness of right hip, not elsewhere classified: Secondary | ICD-10-CM

## 2019-11-28 DIAGNOSIS — M545 Low back pain, unspecified: Secondary | ICD-10-CM

## 2019-11-28 DIAGNOSIS — M25551 Pain in right hip: Secondary | ICD-10-CM

## 2019-11-28 DIAGNOSIS — M25652 Stiffness of left hip, not elsewhere classified: Secondary | ICD-10-CM

## 2019-11-28 NOTE — Therapy (Signed)
[facility] [address], [place], [place], [address] Phone: [phone]   Fax:  [phone]  Physical Therapy Treatment  Patient Details  Name: Luis Ross MRN: [id number] Date of Birth: Feb 08, 1951 Referring Provider (PT): Eulas Post, MD   Encounter Date: 11/28/2019   PT End of Session - 11/28/19 0852    Visit Number 12    Date for PT Re-Evaluation 12/05/19    PT Start Time 0845    PT Stop Time 0928    PT Time Calculation (min) 43 min    Activity Tolerance Patient tolerated treatment well    Behavior During Therapy [facility] for tasks assessed/performed           Past Medical History:  Diagnosis Date  . HYPERLIPIDEMIA 02/26/2010    Past Surgical History:  Procedure Laterality Date  . GANGLION CYST EXCISION  1964   left wrist    There were no vitals filed for this visit.   Subjective Assessment - 11/28/19 0855    Subjective Today I feel great.  I don't wak up at night anymore    Currently in Pain? No/denies                             [facility] Adult PT Treatment/Exercise - 11/28/19 0001      Knee/Hip Exercises: Aerobic   Nustep L5 x 10 min - PT status update   seat 11/UE 11     Knee/Hip Exercises: Standing   Other Standing Knee Exercises side step and monster walk yellow band      Manual Therapy   Soft tissue mobilization right gluteals and piriformis ; bil lumbar            Trigger Point Dry Needling - 11/28/19 0001    Consent Given? Yes    Education Handout Provided Previously provided    Gluteus Minimus Response Twitch response elicited;Palpable increased muscle length    Gluteus Medius Response Twitch response elicited;Palpable increased muscle length    Gluteus Maximus Response Twitch response elicited;Palpable increased muscle length    Piriformis Response Twitch response elicited;Palpable increased muscle length    Lumbar multifidi Response Twitch response  elicited;Palpable increased muscle length                  PT Short Term Goals - 11/12/19 1031      PT SHORT TERM GOAL #1   Title ind with initial HEP    Status Achieved      PT SHORT TERM GOAL #2   Title Pt will report 50% less pain at night when rolling over in bed    Status Achieved      PT SHORT TERM GOAL #3   Title Pt will demonstrate hip strength 5/5 without pain for improved functional transfers and walking    Status On-going             PT Long Term Goals - 11/28/19 0854      PT LONG TERM GOAL #1   Title FOTO < or = to 33%    Status Achieved      PT LONG TERM GOAL #2   Title Pt will be able to perform sit to stand x 5 without UE support and 3/10 pain at most    Status Achieved      PT LONG TERM GOAL #3   Title Pt will be able to walk or ride recumbant  bike for at least 30 minutes/day without increased pain    Baseline walks without pain at least 30 minutes/two miles    Status Achieved                 Plan - 11/28/19 1019    Clinical Impression Statement Pt is doing well with HEP. He was given exercises to be able to continue progressing on his own.  Pt was given one more dry needling session due to this feeling like it helped a lot.  Pt will discharge with HEP and goals met today.    PT Treatment/Interventions ADLs/Self Care Home Management;Biofeedback;Cryotherapy;Electrical Stimulation;Iontophoresis 40m/ml Dexamethasone;Moist Heat;Traction;Ultrasound;Therapeutic activities;Therapeutic exercise;Neuromuscular re-education;Manual techniques;Patient/family education;Dry needling;Passive range of motion;Taping    PT Next Visit Plan d/c    PT Home Exercise Plan Access Code: [email or web address]    Consulted and Agree with Plan of Care Patient           Patient will benefit from skilled therapeutic intervention in order to improve the following deficits and impairments:  Abnormal gait, Decreased range of motion, Difficulty walking, Increased fascial  restricitons, Hypomobility, Impaired flexibility, Decreased strength, Pain, Increased muscle spasms  Visit Diagnosis: Chronic low back pain, unspecified back pain laterality, unspecified whether sciatica present  Pain in right hip  Stiffness of right hip, not elsewhere classified  Pain in left hip  Stiffness of left hip, not elsewhere classified     Problem List Patient Active Problem List   Diagnosis Date Noted  . Essential hypertension 03/09/2019  . Rosacea 01/17/2016  . Type 2 diabetes mellitus with hyperglycemia ([place] 08/30/2014  . Type 2 diabetes mellitus ([place] 07/06/2013  . Nicotine use disorder 04/16/2011  . Hyperlipidemia 02/26/2010    JJule Ser PT 11/28/2019, 11:43 AM  [facility] [address] [place] [address]Phone: [phone]  Fax:  [phone] Name: Luis RAWLMRN: [id number] of Birth: 10/11/1950/01/16 PHYSICAL THERAPY DISCHARGE SUMMARY  Visits from Start of Care: 12  Current functional level related to goals / functional outcomes: See above details   Remaining deficits: See above   Education / Equipment: HEP  Plan: Patient agrees to discharge.  Patient goals were met. Patient is being discharged due to meeting the stated rehab goals.  ?????     [employer] PT 11/28/19 11:45 AM

## 2019-12-17 ENCOUNTER — Other Ambulatory Visit (INDEPENDENT_AMBULATORY_CARE_PROVIDER_SITE_OTHER): Payer: Medicare Other

## 2019-12-17 ENCOUNTER — Other Ambulatory Visit: Payer: Self-pay

## 2019-12-17 DIAGNOSIS — E119 Type 2 diabetes mellitus without complications: Secondary | ICD-10-CM

## 2019-12-17 DIAGNOSIS — E785 Hyperlipidemia, unspecified: Secondary | ICD-10-CM

## 2019-12-17 DIAGNOSIS — I10 Essential (primary) hypertension: Secondary | ICD-10-CM

## 2019-12-18 LAB — LIPID PANEL
Cholesterol: 234 mg/dL — ABNORMAL HIGH (ref <200)
HDL: 38 mg/dL — ABNORMAL LOW (ref >=40)
LDL Cholesterol (Calc): 155 mg/dL (calc) — ABNORMAL HIGH
Non-HDL Cholesterol (Calc): 196 mg/dL (calc) — ABNORMAL HIGH (ref <130)
Total CHOL/HDL Ratio: 6.2 (calc) — ABNORMAL HIGH (ref <5.0)
Triglycerides: 244 mg/dL — ABNORMAL HIGH (ref <150)

## 2019-12-18 LAB — BASIC METABOLIC PANEL WITH GFR
BUN: 17 mg/dL (ref 7–25)
CO2: 26 mmol/L (ref 20–32)
Calcium: 9.3 mg/dL (ref 8.6–10.3)
Chloride: 103 mmol/L (ref 98–110)
Creat: 0.89 mg/dL (ref 0.70–1.25)
GFR, Est African American: 103 mL/min/{1.73_m2} (ref >=60)
GFR, Est Non African American: 88 mL/min/{1.73_m2} (ref >=60)
Glucose, Bld: 183 mg/dL — ABNORMAL HIGH (ref 65–99)
Potassium: 4.5 mmol/L (ref 3.5–5.3)
Sodium: 138 mmol/L (ref 135–146)

## 2019-12-18 LAB — HEMOGLOBIN A1C
Hgb A1c MFr Bld: 6.6 % of total Hgb — ABNORMAL HIGH (ref <5.7)
Mean Plasma Glucose: 143 (calc)
eAG (mmol/L): 7.9 (calc)

## 2019-12-20 ENCOUNTER — Ambulatory Visit (INDEPENDENT_AMBULATORY_CARE_PROVIDER_SITE_OTHER): Payer: Medicare Other | Admitting: Family Medicine

## 2019-12-20 ENCOUNTER — Encounter: Payer: Self-pay | Admitting: Family Medicine

## 2019-12-20 ENCOUNTER — Other Ambulatory Visit: Payer: Self-pay

## 2019-12-20 VITALS — BP 118/64 | HR 110 | Temp 98.4°F | Ht 74.0 in | Wt 225.9 lb

## 2019-12-20 DIAGNOSIS — E785 Hyperlipidemia, unspecified: Secondary | ICD-10-CM | POA: Diagnosis not present

## 2019-12-20 DIAGNOSIS — E1165 Type 2 diabetes mellitus with hyperglycemia: Secondary | ICD-10-CM

## 2019-12-20 MED ORDER — RYBELSUS 14 MG PO TABS: 1.0000 | ORAL_TABLET | Freq: Every day | ORAL | 3 refills | Status: DC

## 2019-12-20 NOTE — Progress Notes (Signed)
Established Patient Office Visit  Subjective:  Patient ID: Luis Ross, male    DOB: January 15, 1951  Age: 69 y.o. MRN: [id number]  CC:  Chief Complaint  Patient presents with  . Diabetes    Doing well, finished 6 weeks of PT and back pain is better and still has some hip pain    HPI Luis Ross presents for medical follow-up.  He has had some recent low back pain and hip pain and went for physical therapy.  They did some dry needling along with core strengthening and stretches and that seems to be improving.  He has now been able to progress to walking 2 to 3 miles daily.  He is tolerating Rybelsus 7 mg daily.  Would like to consider further titration at this point.  His fasting blood sugars have still been quite high but tend to be better later in the day.  There was consideration given to whether his back pain was related to his statin use.  He had been on pravastatin low dosage.  He has not seen any changes in myalgias since stopping that.  He has tried multiple other statins without success.  He is even had myalgias symptoms with Zetia.  Had recent labs and those were reviewed.  Lipids are back up.  A1C stable at 6.6%.   Past Medical History:  Diagnosis Date  . HYPERLIPIDEMIA 02/26/2010    Past Surgical History:  Procedure Laterality Date  . GANGLION CYST EXCISION  1964   left wrist    Family History  Problem Relation Age of Onset  . Hypertension Mother   . Heart disease Mother   . Alcohol abuse Father     Social History   Socioeconomic History  . Marital status: Married    Spouse name: Not on file  . Number of children: Not on file  . Years of education: Not on file  . Highest education level: Not on file  Occupational History  . Not on file  Tobacco Use  . Smoking status: Former Smoker    Packs/day: 0.50    Years: 10.00    Pack years: 5.00    Types: Cigarettes    Quit date: 11/27/2011    Years since quitting: 8.0  . Smokeless tobacco: Never Used    Vaping Use  . Vaping Use: Never used  Substance and Sexual Activity  . Alcohol use: No    Alcohol/week: 0.0 standard drinks  . Drug use: No  . Sexual activity: Not on file  Other Topics Concern  . Not on file  Social History Narrative  . Not on file   Social Determinants of Health   Financial Resource Strain:   . Difficulty of Paying Living Expenses: Not on file  Food Insecurity:   . Worried About Charity fundraiser in the Last Year: Not on file  . Ran Out of Food in the Last Year: Not on file  Transportation Needs:   . Lack of Transportation (Medical): Not on file  . Lack of Transportation (Non-Medical): Not on file  Physical Activity:   . Days of Exercise per Week: Not on file  . Minutes of Exercise per Session: Not on file  Stress:   . Feeling of Stress : Not on file  Social Connections:   . Frequency of Communication with Friends and Family: Not on file  . Frequency of Social Gatherings with Friends and Family: Not on file  . Attends Religious Services: Not on file  .  Active Member of Clubs or Organizations: Not on file  . Attends Archivist Meetings: Not on file  . Marital Status: Not on file  Intimate Partner Violence:   . Fear of Current or Ex-Partner: Not on file  . Emotionally Abused: Not on file  . Physically Abused: Not on file  . Sexually Abused: Not on file    Outpatient Medications Prior to Visit  Medication Sig Dispense Refill  . canagliflozin (INVOKANA) 300 MG TABS tablet Take 1 tablet (300 mg total) by mouth daily. 90 tablet 3  . glucose blood (ONETOUCH VERIO) test strip Use as instructed to test blood glucose 2 times daily. 100 each 1  . losartan (COZAAR) 25 MG tablet Take 1 tablet (25 mg total) by mouth daily. 90 tablet 3  . OneTouch Delica Lancets 73U MISC Use to test blood glucose 2 times daily. 100 each 3  . Semaglutide (RYBELSUS) 7 MG TABS Take 7 mg by mouth daily. 90 tablet 3  . Blood Glucose Monitoring Suppl ([place]) w/Device KIT Use to test blood glucose 2 times daily. (Patient not taking: Reported on 12/20/2019) 1 kit 1  . ezetimibe (ZETIA) 10 MG tablet TAKE 1 TABLET BY MOUTH  DAILY (Patient not taking: Reported on 09/09/2019) 90 tablet 1   No facility-administered medications prior to visit.    Allergies  Allergen Reactions  . Crestor [Rosuvastatin Calcium]     myalgias  . Metformin And Related Diarrhea    ROS Review of Systems  Constitutional: Negative for fatigue.  Eyes: Negative for visual disturbance.  Respiratory: Negative for cough, chest tightness and shortness of breath.   Cardiovascular: Negative for chest pain, palpitations and leg swelling.  Endocrine: Negative for polydipsia and polyuria.  Neurological: Negative for dizziness, syncope, weakness, light-headedness and headaches.      Objective:    Physical Exam Constitutional:      Appearance: He is well-developed.  HENT:     Right Ear: External ear normal.     Left Ear: External ear normal.  Eyes:     Pupils: Pupils are equal, round, and reactive to light.  Neck:     Thyroid: No thyromegaly.  Cardiovascular:     Rate and Rhythm: Normal rate and regular rhythm.  Pulmonary:     Effort: Pulmonary effort is normal. No respiratory distress.     Breath sounds: Normal breath sounds. No wheezing or rales.  Musculoskeletal:     Cervical back: Neck supple.  Neurological:     Mental Status: He is alert and oriented to person, place, and time.     BP 118/64   Pulse (!) 110   Temp 98.4 F (36.9 C) (Oral)   Ht 6' 2" (1.88 m)   Wt 225 lb 14.4 oz (102.5 kg)   SpO2 98%   BMI 29.00 kg/m  Wt Readings from Last 3 Encounters:  12/20/19 225 lb 14.4 oz (102.5 kg)  09/09/19 216 lb 12.8 oz (98.3 kg)  06/10/19 214 lb (97.1 kg)     Health Maintenance Due  Topic Date Due  . COVID-19 Vaccine (1) Never done  . FOOT EXAM  04/29/2017  . OPHTHALMOLOGY EXAM  02/03/2018    There are no preventive care reminders to display  for this patient.  Lab Results  Component Value Date   TSH 1.27 02/26/2019   Lab Results  Component Value Date   WBC 4.6 09/22/2017   HGB 16.7 09/22/2017   HCT 48.6 09/22/2017   MCV  93.2 09/22/2017   PLT 202.0 09/22/2017   Lab Results  Component Value Date   NA 138 12/17/2019   K 4.5 12/17/2019   CO2 26 12/17/2019   GLUCOSE 183 (H) 12/17/2019   BUN 17 12/17/2019   CREATININE 0.89 12/17/2019   BILITOT 0.7 07/22/2019   ALKPHOS 48 07/22/2019   AST 15 07/22/2019   ALT 19 07/22/2019   PROT 7.2 07/22/2019   ALBUMIN 4.7 07/22/2019   CALCIUM 9.3 12/17/2019   GFR 103.94 06/03/2019   Lab Results  Component Value Date   CHOL 234 (H) 12/17/2019   Lab Results  Component Value Date   HDL 38 (L) 12/17/2019   Lab Results  Component Value Date   LDLCALC 155 (H) 12/17/2019   Lab Results  Component Value Date   TRIG 244 (H) 12/17/2019   Lab Results  Component Value Date   CHOLHDL 6.2 (H) 12/17/2019   Lab Results  Component Value Date   HGBA1C 6.6 (H) 12/17/2019      Assessment & Plan:   Problem List Items Addressed This Visit      Unprioritized   Type 2 diabetes mellitus with hyperglycemia ([place]) - Primary   Relevant Medications   Semaglutide (RYBELSUS) 14 MG TABS   Other Relevant Orders   Hemoglobin A1c   Hyperlipidemia   Relevant Orders   Lipid panel   CMP    A1c 6.6%.  Still has elevated fasting sugars  Titrate Rybelsus to 14 mg daily He will try to start back pravastatin 20 mg daily 105-monthfollow-up labs entered with lipid, hepatic, comprehensive metabolic panel, [id number] with exercise as tolerated  Meds ordered this encounter  Medications  . Semaglutide (RYBELSUS) 14 MG TABS    Sig: Take 1 tablet by mouth daily.    Dispense:  90 tablet    Refill:  3    Follow-up: No follow-ups on file.    BCarolann Littler MD

## 2020-03-16 ENCOUNTER — Other Ambulatory Visit (INDEPENDENT_AMBULATORY_CARE_PROVIDER_SITE_OTHER): Payer: Medicare Other

## 2020-03-16 ENCOUNTER — Other Ambulatory Visit: Payer: Self-pay

## 2020-03-16 DIAGNOSIS — E1165 Type 2 diabetes mellitus with hyperglycemia: Secondary | ICD-10-CM

## 2020-03-16 DIAGNOSIS — E785 Hyperlipidemia, unspecified: Secondary | ICD-10-CM

## 2020-03-17 LAB — LIPID PANEL
Cholesterol: 147 mg/dL (ref <200)
HDL: 42 mg/dL (ref >=40)
LDL Cholesterol (Calc): 83 mg/dL (calc)
Non-HDL Cholesterol (Calc): 105 mg/dL (calc) (ref <130)
Total CHOL/HDL Ratio: 3.5 (calc) (ref <5.0)
Triglycerides: 120 mg/dL (ref <150)

## 2020-03-17 LAB — COMPREHENSIVE METABOLIC PANEL
AG Ratio: 1.6 (calc) (ref 1.0–2.5)
ALT: 38 U/L (ref 9–46)
AST: 33 U/L (ref 10–35)
Albumin: 4.8 g/dL (ref 3.6–5.1)
Alkaline phosphatase (APISO): 42 U/L (ref 35–144)
BUN: 24 mg/dL (ref 7–25)
CO2: 17 mmol/L — ABNORMAL LOW (ref 20–32)
Calcium: 10.1 mg/dL (ref 8.6–10.3)
Chloride: 107 mmol/L (ref 98–110)
Creat: 0.89 mg/dL (ref 0.70–1.25)
Globulin: 3 g/dL (calc) (ref 1.9–3.7)
Glucose, Bld: 133 mg/dL — ABNORMAL HIGH (ref 65–99)
Potassium: 5.9 mmol/L — ABNORMAL HIGH (ref 3.5–5.3)
Sodium: 141 mmol/L (ref 135–146)
Total Bilirubin: 0.7 mg/dL (ref 0.2–1.2)
Total Protein: 7.8 g/dL (ref 6.1–8.1)

## 2020-03-17 LAB — HEMOGLOBIN A1C
Hgb A1c MFr Bld: 6.2 % of total Hgb — ABNORMAL HIGH (ref <5.7)
Mean Plasma Glucose: 131 (calc)
eAG (mmol/L): 7.3 (calc)

## 2020-03-23 ENCOUNTER — Ambulatory Visit (INDEPENDENT_AMBULATORY_CARE_PROVIDER_SITE_OTHER): Payer: Medicare Other

## 2020-03-23 ENCOUNTER — Ambulatory Visit (INDEPENDENT_AMBULATORY_CARE_PROVIDER_SITE_OTHER): Payer: Medicare Other | Admitting: Family Medicine

## 2020-03-23 ENCOUNTER — Other Ambulatory Visit: Payer: Self-pay

## 2020-03-23 ENCOUNTER — Encounter: Payer: Self-pay | Admitting: Family Medicine

## 2020-03-23 VITALS — BP 124/68 | HR 94 | Temp 98.4°F | Ht 74.0 in | Wt 214.8 lb

## 2020-03-23 DIAGNOSIS — E785 Hyperlipidemia, unspecified: Secondary | ICD-10-CM | POA: Diagnosis not present

## 2020-03-23 DIAGNOSIS — Z125 Encounter for screening for malignant neoplasm of prostate: Secondary | ICD-10-CM | POA: Diagnosis not present

## 2020-03-23 DIAGNOSIS — M25551 Pain in right hip: Secondary | ICD-10-CM | POA: Diagnosis not present

## 2020-03-23 DIAGNOSIS — E1165 Type 2 diabetes mellitus with hyperglycemia: Secondary | ICD-10-CM

## 2020-03-23 DIAGNOSIS — Z0000 Encounter for general adult medical examination without abnormal findings: Secondary | ICD-10-CM

## 2020-03-23 MED ORDER — PRAVASTATIN SODIUM 40 MG PO TABS: 40.0000 mg | ORAL_TABLET | Freq: Every day | ORAL | 3 refills | Status: DC

## 2020-03-23 NOTE — Progress Notes (Unsigned)
Discussed x-rays of hip with patient.  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  Suspect recent right buttock and hip pain related to lumbar nerve root impingement.  Recent MRI lumbar spine revealed multilevel degenerative changes with moderate spinal canal stenosis L4-L5 and severe left foraminal stenosis L5-S1.  We will set up neurosurgical referral as current pain is impacting his daily activities and has persisted for several weeks

## 2020-03-23 NOTE — Patient Instructions (Signed)
Mr. Luis Ross , Thank you for taking time to come for your Medicare Wellness Visit. I appreciate your ongoing commitment to your health goals. Please review the following plan we discussed and let me know if I can assist you in the future.   Screening recommendations/referrals: Colonoscopy: Up to date next due 08/20/2020 Recommended yearly ophthalmology/optometry visit for glaucoma screening and checkup Recommended yearly dental visit for hygiene and checkup  Vaccinations: Influenza vaccine: Up to date, next due fall 2022 Pneumococcal vaccine: Completed series  Tdap vaccine: Up to date, next due 04/10/2023 Shingles vaccine: Currently due for Shingrix, if you wish to receive please do so at your local pharmacy as it is less expensive     Advanced directives: Please bring in a copy of your advanced medical directives so that we may scan them into your chart.   Conditions/risks identified: None   Next appointment: 07/01/2020 @ 8:00 am with Dr. Caryl Never  Preventive Care 67 Years and Older, Male Preventive care refers to lifestyle choices and visits with your health care provider that can promote health and wellness. What does preventive care include?  A yearly physical exam. This is also called an annual well check.  Dental exams once or twice a year.  Routine eye exams. Ask your health care provider how often you should have your eyes checked.  Personal lifestyle choices, including:  Daily care of your teeth and gums.  Regular physical activity.  Eating a healthy diet.  Avoiding tobacco and drug use.  Limiting alcohol use.  Practicing safe sex.  Taking low doses of aspirin every day.  Taking vitamin and mineral supplements as recommended by your health care provider. What happens during an annual well check? The services and screenings done by your health care provider during your annual well check will depend on your age, overall health, lifestyle risk factors, and family  history of disease. Counseling  Your health care provider may ask you questions about your:  Alcohol use.  Tobacco use.  Drug use.  Emotional well-being.  Home and relationship well-being.  Sexual activity.  Eating habits.  History of falls.  Memory and ability to understand (cognition).  Work and work Astronomer. Screening  You may have the following tests or measurements:  Height, weight, and BMI.  Blood pressure.  Lipid and cholesterol levels. These may be checked every 5 years, or more frequently if you are over 22 years old.  Skin check.  Lung cancer screening. You may have this screening every year starting at age 58 if you have a 30-pack-year history of smoking and currently smoke or have quit within the past 15 years.  Fecal occult blood test (FOBT) of the stool. You may have this test every year starting at age 14.  Flexible sigmoidoscopy or colonoscopy. You may have a sigmoidoscopy every 5 years or a colonoscopy every 10 years starting at age 55.  Prostate cancer screening. Recommendations will vary depending on your family history and other risks.  Hepatitis C blood test.  Hepatitis B blood test.  Sexually transmitted disease (STD) testing.  Diabetes screening. This is done by checking your blood sugar (glucose) after you have not eaten for a while (fasting). You may have this done every 1-3 years.  Abdominal aortic aneurysm (AAA) screening. You may need this if you are a current or former smoker.  Osteoporosis. You may be screened starting at age 34 if you are at high risk. Talk with your health care provider about your test results, treatment  options, and if necessary, the need for more tests. Vaccines  Your health care provider may recommend certain vaccines, such as:  Influenza vaccine. This is recommended every year.  Tetanus, diphtheria, and acellular pertussis (Tdap, Td) vaccine. You may need a Td booster every 10 years.  Zoster vaccine.  You may need this after age 92.  Pneumococcal 13-valent conjugate (PCV13) vaccine. One dose is recommended after age 21.  Pneumococcal polysaccharide (PPSV23) vaccine. One dose is recommended after age 63. Talk to your health care provider about which screenings and vaccines you need and how often you need them. This information is not intended to replace advice given to you by your health care provider. Make sure you discuss any questions you have with your health care provider. Document Released: 05/08/2015 Document Revised: 12/30/2015 Document Reviewed: 02/10/2015 Elsevier Interactive Patient Education  2017 [place] Prevention in the Home Falls can cause injuries. They can happen to people of all ages. There are many things you can do to make your home safe and to help prevent falls. What can I do on the outside of my home?  Regularly fix the edges of walkways and driveways and fix any cracks.  Remove anything that might make you trip as you walk through a door, such as a raised step or threshold.  Trim any bushes or trees on the path to your home.  Use bright outdoor lighting.  Clear any walking paths of anything that might make someone trip, such as rocks or tools.  Regularly check to see if handrails are loose or broken. Make sure that both sides of any steps have handrails.  Any raised decks and porches should have guardrails on the edges.  Have any leaves, snow, or ice cleared regularly.  Use sand or salt on walking paths during winter.  Clean up any spills in your garage right away. This includes oil or grease spills. What can I do in the bathroom?  Use night lights.  Install grab bars by the toilet and in the tub and shower. Do not use towel bars as grab bars.  Use non-skid mats or decals in the tub or shower.  If you need to sit down in the shower, use a plastic, non-slip stool.  Keep the floor dry. Clean up any water that spills on the floor as soon  as it happens.  Remove soap buildup in the tub or shower regularly.  Attach bath mats securely with double-sided non-slip rug tape.  Do not have throw rugs and other things on the floor that can make you trip. What can I do in the bedroom?  Use night lights.  Make sure that you have a light by your bed that is easy to reach.  Do not use any sheets or blankets that are too big for your bed. They should not hang down onto the floor.  Have a firm chair that has side arms. You can use this for support while you get dressed.  Do not have throw rugs and other things on the floor that can make you trip. What can I do in the kitchen?  Clean up any spills right away.  Avoid walking on wet floors.  Keep items that you use a lot in easy-to-reach places.  If you need to reach something above you, use a strong step stool that has a grab bar.  Keep electrical cords out of the way.  Do not use floor polish or wax that makes floors slippery.  If you must use wax, use non-skid floor wax.  Do not have throw rugs and other things on the floor that can make you trip. What can I do with my stairs?  Do not leave any items on the stairs.  Make sure that there are handrails on both sides of the stairs and use them. Fix handrails that are broken or loose. Make sure that handrails are as long as the stairways.  Check any carpeting to make sure that it is firmly attached to the stairs. Fix any carpet that is loose or worn.  Avoid having throw rugs at the top or bottom of the stairs. If you do have throw rugs, attach them to the floor with carpet tape.  Make sure that you have a light switch at the top of the stairs and the bottom of the stairs. If you do not have them, ask someone to add them for you. What else can I do to help prevent falls?  Wear shoes that:  Do not have high heels.  Have rubber bottoms.  Are comfortable and fit you well.  Are closed at the toe. Do not wear sandals.  If  you use a stepladder:  Make sure that it is fully opened. Do not climb a closed stepladder.  Make sure that both sides of the stepladder are locked into place.  Ask someone to hold it for you, if possible.  Clearly mark and make sure that you can see:  Any grab bars or handrails.  First and last steps.  Where the edge of each step is.  Use tools that help you move around (mobility aids) if they are needed. These include:  Canes.  Walkers.  Scooters.  Crutches.  Turn on the lights when you go into a dark area. Replace any light bulbs as soon as they burn out.  Set up your furniture so you have a clear path. Avoid moving your furniture around.  If any of your floors are uneven, fix them.  If there are any pets around you, be aware of where they are.  Review your medicines with your doctor. Some medicines can make you feel dizzy. This can increase your chance of falling. Ask your doctor what other things that you can do to help prevent falls. This information is not intended to replace advice given to you by your health care provider. Make sure you discuss any questions you have with your health care provider. Document Released: 02/05/2009 Document Revised: 09/17/2015 Document Reviewed: 05/16/2014 Elsevier Interactive Patient Education  2017 [employer].

## 2020-03-23 NOTE — Progress Notes (Signed)
Established Patient Office Visit  Subjective:  Patient ID: Luis Ross, male    DOB: 1950/05/26  Age: 69 y.o. MRN: [id number]  CC:  Chief Complaint  Patient presents with  . Follow-up    3 month follow up on DM, sugars well at home. Would like x-ray of right hip for pains x 6+ months.     HPI Luis Ross presents for medical follow-up.  We had recently initiated Rybelsus and he is currently on 14 mg daily.  He has lost about 11 pounds.  He attributes a lot of this to not eating late at night.  His fasting blood sugars have improved.  He had recent labs and A1c improved to 6.2%.  He had potassium of 5.9 but we suspect hemolysis.  He had difficult needlestick and had significant bruising.  He has had intolerance with multiple statins in the past but currently tolerating pravastatin 20 mg daily.  Recent lipids improved.  We discussed titrating this further to 40 mg to try to get his LDL less than 70.  Positive family history of coronary disease in his mother.  He has had some right hip pains for several months.  He has had some intermittent low back pain as well and had some physical therapy which helped.  Denies any classic radiculitis symptoms.  His pain is mostly posterior lateral hip.  Not tender over the greater trochanteric bursa.  He is able to walk without much difficulty.  Would like to get some hip x-rays given duration of symptoms.  Past Medical History:  Diagnosis Date  . HYPERLIPIDEMIA 02/26/2010    Past Surgical History:  Procedure Laterality Date  . GANGLION CYST EXCISION  1964   left wrist    Family History  Problem Relation Age of Onset  . Hypertension Mother   . Heart disease Mother   . Alcohol abuse Father     Social History   Socioeconomic History  . Marital status: Married    Spouse name: Not on file  . Number of children: Not on file  . Years of education: Not on file  . Highest education level: Not on file  Occupational History  . Not on  file  Tobacco Use  . Smoking status: Former Smoker    Packs/day: 0.50    Years: 10.00    Pack years: 5.00    Types: Cigarettes    Quit date: 11/27/2011    Years since quitting: 8.3  . Smokeless tobacco: Never Used  Vaping Use  . Vaping Use: Never used  Substance and Sexual Activity  . Alcohol use: No    Alcohol/week: 0.0 standard drinks  . Drug use: No  . Sexual activity: Not on file  Other Topics Concern  . Not on file  Social History Narrative  . Not on file   Social Determinants of Health   Financial Resource Strain: Low Risk   . Difficulty of Paying Living Expenses: Not hard at all  Food Insecurity: No Food Insecurity  . Worried About Charity fundraiser in the Last Year: Never true  . Ran Out of Food in the Last Year: Never true  Transportation Needs: No Transportation Needs  . Lack of Transportation (Medical): No  . Lack of Transportation (Non-Medical): No  Physical Activity: Insufficiently Active  . Days of Exercise per Week: 3 days  . Minutes of Exercise per Session: 30 min  Stress: No Stress Concern Present  . Feeling of Stress : Not at all  Social Connections: Socially Integrated  . Frequency of Communication with Friends and Family: More than three times a week  . Frequency of Social Gatherings with Friends and Family: More than three times a week  . Attends Religious Services: More than 4 times per year  . Active Member of Clubs or Organizations: Yes  . Attends Archivist Meetings: More than 4 times per year  . Marital Status: Married  Human resources officer Violence: Not At Risk  . Fear of Current or Ex-Partner: No  . Emotionally Abused: No  . Physically Abused: No  . Sexually Abused: No    Outpatient Medications Prior to Visit  Medication Sig Dispense Refill  . Blood Glucose Monitoring Suppl ([place]) w/Device KIT Use to test blood glucose 2 times daily. 1 kit 1  . canagliflozin (INVOKANA) 300 MG TABS tablet Take 1 tablet (300  mg total) by mouth daily. 90 tablet 3  . glucose blood (ONETOUCH VERIO) test strip Use as instructed to test blood glucose 2 times daily. 100 each 1  . losartan (COZAAR) 25 MG tablet Take 1 tablet (25 mg total) by mouth daily. 90 tablet 3  . OneTouch Delica Lancets 77A MISC Use to test blood glucose 2 times daily. 100 each 3  . Semaglutide (RYBELSUS) 14 MG TABS Take 1 tablet by mouth daily. 90 tablet 3  . pravastatin (PRAVACHOL) 20 MG tablet Take 20 mg by mouth daily.     No facility-administered medications prior to visit.    Allergies  Allergen Reactions  . Crestor [Rosuvastatin Calcium]     myalgias  . Metformin And Related Diarrhea    ROS Review of Systems  Constitutional: Negative for fatigue and unexpected weight change.  Eyes: Negative for visual disturbance.  Respiratory: Negative for cough, chest tightness and shortness of breath.   Cardiovascular: Negative for chest pain, palpitations and leg swelling.  Endocrine: Negative for polydipsia and polyuria.  Musculoskeletal: Positive for arthralgias.  Neurological: Negative for dizziness, syncope, weakness, light-headedness and headaches.      Objective:    Physical Exam Vitals reviewed.  Constitutional:      Appearance: Normal appearance.  Cardiovascular:     Rate and Rhythm: Normal rate and regular rhythm.  Pulmonary:     Effort: Pulmonary effort is normal.     Breath sounds: Normal breath sounds.  Musculoskeletal:     Comments: Right hip reveals fairly good range of motion.  He has some slight restriction with internal rotation.  He has no localized tenderness over the bursa region.  Neurological:     Mental Status: He is alert.     BP 124/68   Pulse 94   Temp 98.4 F (36.9 C) (Oral)   Ht '6\' 2"'  (1.88 m)   Wt 214 lb 12.8 oz (97.4 kg)   SpO2 98%   BMI 27.58 kg/m  Wt Readings from Last 3 Encounters:  03/23/20 214 lb 12.3 oz (97.4 kg)  03/23/20 214 lb 12.8 oz (97.4 kg)  12/20/19 225 lb 14.4 oz (102.5 kg)      Health Maintenance Due  Topic Date Due  . FOOT EXAM  04/29/2017  . PNA vac Low Risk Adult (2 of 2 - PPSV23) 01/28/2020    There are no preventive care reminders to display for this patient.  Lab Results  Component Value Date   TSH 1.27 02/26/2019   Lab Results  Component Value Date   WBC 4.6 09/22/2017   HGB 16.7 09/22/2017   HCT  48.6 09/22/2017   MCV 93.2 09/22/2017   PLT 202.0 09/22/2017   Lab Results  Component Value Date   NA 141 03/16/2020   K 5.9 (H) 03/16/2020   CO2 17 (L) 03/16/2020   GLUCOSE 133 (H) 03/16/2020   BUN 24 03/16/2020   CREATININE 0.89 03/16/2020   BILITOT 0.7 03/16/2020   ALKPHOS 48 07/22/2019   AST 33 03/16/2020   ALT 38 03/16/2020   PROT 7.8 03/16/2020   ALBUMIN 4.7 07/22/2019   CALCIUM 10.1 03/16/2020   GFR 103.94 06/03/2019   Lab Results  Component Value Date   CHOL 147 03/16/2020   Lab Results  Component Value Date   HDL 42 03/16/2020   Lab Results  Component Value Date   LDLCALC 83 03/16/2020   Lab Results  Component Value Date   TRIG 120 03/16/2020   Lab Results  Component Value Date   CHOLHDL 3.5 03/16/2020   Lab Results  Component Value Date   HGBA1C 6.2 (H) 03/16/2020      Assessment & Plan:   #1 type 2 diabetes improved with A1c 6.2%.  Continue current diabetic regimen  #2 dyslipidemia.  Goal LDL less than 70 -Increase pravastatin to 40 mg daily -Recheck fasting lipid and comprehensive metabolic panel at 15-monthfollow-up  #3 right hip pain.  Somewhat poorly localized. -Obtain x-rays right hip to further assess -Continue walking as tolerated -Consider orthopedic or sports medicine follow-up if not improved and x-rays unrevealing  #4 request for PSA screening.  PSA last year was 0.18. -Future lab order placed for PSA at 361-monthabs  Meds ordered this encounter  Medications  . pravastatin (PRAVACHOL) 40 MG tablet    Sig: Take 1 tablet (40 mg total) by mouth daily.    Dispense:  90 tablet     Refill:  3    Follow-up: No follow-ups on file.    BrCarolann LittlerMD

## 2020-03-23 NOTE — Progress Notes (Signed)
Subjective:   Luis Ross is a 69 y.o. male who presents for an Initial Medicare Annual Wellness Visit.    Review of Systems    N/A  Cardiac Risk Factors include: advanced age (>21mn, >>43women);male gender;diabetes mellitus;hypertension     Objective:    Today's Vitals   03/23/20 0916  BP: 124/68  Pulse: 94  Temp: 98.4 F (36.9 C)  TempSrc: Oral  SpO2: 98%  Weight: 214 lb 12.3 oz (97.4 kg)  Height: 6' 2" (1.88 m)  PainSc: 6    Body mass index is 27.57 kg/m.  Advanced Directives 03/23/2020 10/10/2019  Does Patient Have a Medical Advance Directive? No;Yes Yes  Type of AParamed[place]of [place] Does patient want to make changes to medical advance directive? No - Patient declined -  Copy of H[place]in Chart? No - copy requested No - copy requested    Current Medications (verified) Outpatient Encounter Medications as of 03/23/2020  Medication Sig  . Blood Glucose Monitoring Suppl ([place] w/Device KIT Use to test blood glucose 2 times daily.  . canagliflozin (INVOKANA) 300 MG TABS tablet Take 1 tablet (300 mg total) by mouth daily.  .Marland Kitchenglucose blood (ONETOUCH VERIO) test strip Use as instructed to test blood glucose 2 times daily.  .Marland Kitchenlosartan (COZAAR) 25 MG tablet Take 1 tablet (25 mg total) by mouth daily.  .Glory RosebushDelica Lancets 392EMISC Use to test blood glucose 2 times daily.  . pravastatin (PRAVACHOL) 40 MG tablet Take 1 tablet (40 mg total) by mouth daily.  . Semaglutide (RYBELSUS) 14 MG TABS Take 1 tablet by mouth daily.  . [DISCONTINUED] pravastatin (PRAVACHOL) 20 MG tablet Take 20 mg by mouth daily.   No facility-administered encounter medications on file as of 03/23/2020.    Allergies (verified) Crestor [rosuvastatin calcium] and Metformin and related   History: Past Medical History:  Diagnosis Date  . HYPERLIPIDEMIA 02/26/2010   Past  Surgical History:  Procedure Laterality Date  . GANGLION CYST EXCISION  1964   left wrist   Family History  Problem Relation Age of Onset  . Hypertension Mother   . Heart disease Mother   . Alcohol abuse Father    Social History   Socioeconomic History  . Marital status: Married    Spouse name: Not on file  . Number of children: Not on file  . Years of education: Not on file  . Highest education level: Not on file  Occupational History  . Not on file  Tobacco Use  . Smoking status: Former Smoker    Packs/day: 0.50    Years: 10.00    Pack years: 5.00    Types: Cigarettes    Quit date: 11/27/2011    Years since quitting: 8.3  . Smokeless tobacco: Never Used  Vaping Use  . Vaping Use: Never used  Substance and Sexual Activity  . Alcohol use: No    Alcohol/week: 0.0 standard drinks  . Drug use: No  . Sexual activity: Not on file  Other Topics Concern  . Not on file  Social History Narrative  . Not on file   Social Determinants of Health   Financial Resource Strain: Low Risk   . Difficulty of Paying Living Expenses: Not hard at all  Food Insecurity: No Food Insecurity  . Worried About RCharity fundraiserin the Last Year: Never true  . Ran Out of Food  in the Last Year: Never true  Transportation Needs: No Transportation Needs  . Lack of Transportation (Medical): No  . Lack of Transportation (Non-Medical): No  Physical Activity: Insufficiently Active  . Days of Exercise per Week: 3 days  . Minutes of Exercise per Session: 30 min  Stress: No Stress Concern Present  . Feeling of Stress : Not at all  Social Connections: Socially Integrated  . Frequency of Communication with Friends and Family: More than three times a week  . Frequency of Social Gatherings with Friends and Family: More than three times a week  . Attends Religious Services: More than 4 times per year  . Active Member of Clubs or Organizations: Yes  . Attends Club or Organization Meetings: More than 4  times per year  . Marital Status: Married    Tobacco Counseling Counseling given: Not Answered   Clinical Intake:  Pre-visit preparation completed: Yes  Pain : 0-10 Pain Score: 6  Pain Type: Chronic pain Pain Location: Hip Pain Orientation: Right Pain Descriptors / Indicators: Aching Pain Onset: More than a month ago Pain Frequency: Intermittent     Nutritional Risks: None Diabetes: Yes CBG done?: No Did pt. bring in CBG monitor from home?: No  How often do you need to have someone help you when you read instructions, pamphlets, or other written materials from your doctor or pharmacy?: 1 - Never What is the last grade level you completed in school?: College  Diabetic?Yes Nutrition Risk Assessment:  Has the patient had any N/V/D within the last 2 months?  No  Does the patient have any non-healing wounds?  No  Has the patient had any unintentional weight loss or weight gain?  No   Diabetes:  Is the patient diabetic?  Yes  If diabetic, was a CBG obtained today?  No  Did the patient bring in their glucometer from home?  No  How often do you monitor your CBG's? Patient checks glucose 2x per day .   Financial Strains and Diabetes Management:  Are you having any financial strains with the device, your supplies or your medication? No .  Does the patient want to be seen by Chronic Care Management for management of their diabetes?  No  Would the patient like to be referred to a Nutritionist or for Diabetic Management?  No   Diabetic Exams:  Diabetic Eye Exam: Completed 02/2020 Diabetic Foot Exam: Overdue, Pt has been advised about the importance in completing this exam. Pt is scheduled for diabetic foot exam on 07/01/2020.   Interpreter Needed?: No  Information entered by :: SCrews,LPN   Activities of Daily Living In your present state of health, do you have any difficulty performing the following activities: 03/23/2020  Hearing? N  Vision? N  Difficulty  concentrating or making decisions? N  Walking or climbing stairs? N  Dressing or bathing? N  Doing errands, shopping? N  Preparing Food and eating ? N  Using the Toilet? N  In the past six months, have you accidently leaked urine? N  Do you have problems with loss of bowel control? N  Managing your Medications? N  Managing your Finances? N  Housekeeping or managing your Housekeeping? N  Some recent data might be hidden    Patient Care Team: Burchette, Bruce W, MD as PCP - General  Indicate any recent Medical Services you may have received from other than [facility] providers in the past year (date may be approximate).     Assessment:     This is a routine wellness examination for Luis Ross.  Hearing/Vision screen  Hearing Screening   125Hz 250Hz 500Hz 1000Hz 2000Hz 3000Hz 4000Hz 6000Hz 8000Hz  Right ear:           Left ear:           Vision Screening Comments: Patient states gets eyes examined once per year    Dietary issues and exercise activities discussed: Current Exercise Habits: Home exercise routine, Type of exercise: walking, Time (Minutes): 30, Frequency (Times/Week): 3, Weekly Exercise (Minutes/Week): 90, Intensity: Mild  Goals    . Exercise 150 min/wk Moderate Activity      Depression Screen PHQ 2/9 Scores 03/23/2020 03/23/2020 09/09/2019 09/29/2017  PHQ - 2 Score 0 0 0 0  PHQ- 9 Score 0 - - -    Fall Risk Fall Risk  03/23/2020 03/23/2020 09/09/2019 09/29/2017  Falls in the past year? 0 0 0 No  Number falls in past yr: 0 - 0 -  Injury with Fall? 0 - 0 -  Risk for fall due to : No Fall Risks - - -  Follow up Falls evaluation completed;Falls prevention discussed - Falls evaluation completed -    Any stairs in or around the home? Yes  If so, are there any without handrails? No  Home free of loose throw rugs in walkways, pet beds, electrical cords, etc? Yes  Adequate lighting in your home to reduce risk of falls? Yes   ASSISTIVE DEVICES UTILIZED TO PREVENT  FALLS:  Life alert? No  Use of a cane, walker or w/c? No  Grab bars in the bathroom? No  Shower chair or bench in shower? No  Elevated toilet seat or a handicapped toilet? No   TIMED UP AND GO:  Was the test performed? Yes .  Length of time to ambulate 10 feet: 4 sec.   Gait steady and fast without use of assistive device  Cognitive Function:        Immunizations Immunization History  Administered Date(s) Administered  . Influenza, High Dose Seasonal PF 01/30/2019  . Influenza, Seasonal, Injecte, Preservative Fre 02/11/2014, 02/05/2018  . Influenza-Unspecified 01/24/2015, 01/24/2016, 02/01/2017  . PFIZER SARS-COV-2 Vaccination 05/23/2019, 06/20/2019, 01/19/2020  . Pneumococcal Conjugate-13 01/24/2015, 02/05/2018  . Pneumococcal Polysaccharide-23 01/28/2015  . Td 04/26/2003  . Tdap 04/09/2013    TDAP status: Up to date Flu Vaccine status: Up to date Pneumococcal vaccine status: Up to date Covid-19 vaccine status: Completed vaccines  Qualifies for Shingles Vaccine? Yes   Zostavax completed No   Shingrix Completed?: No.    Education has been provided regarding the importance of this vaccine. Patient has been advised to call insurance company to determine out of pocket expense if they have not yet received this vaccine. Advised may also receive vaccine at local pharmacy or Health Dept. Verbalized acceptance and understanding.  Screening Tests Health Maintenance  Topic Date Due  . FOOT EXAM  04/29/2017  . PNA vac Low Risk Adult (2 of 2 - PPSV23) 01/28/2020  . COLONOSCOPY  08/20/2020  . HEMOGLOBIN A1C  09/13/2020  . OPHTHALMOLOGY EXAM  02/04/2021  . TETANUS/TDAP  04/10/2023  . COVID-19 Vaccine  Completed  . Hepatitis C Screening  Completed  . INFLUENZA VACCINE  Discontinued    Health Maintenance  Health Maintenance Due  Topic Date Due  . FOOT EXAM  04/29/2017  . PNA vac Low Risk Adult (2 of 2 - PPSV23) 01/28/2020    Colorectal cancer screening: Completed  08/21/2015. Repeat every 5 years    Lung Cancer Screening: (Low Dose CT Chest recommended if Age 55-80 years, 30 pack-year currently smoking OR have quit w/in 15years.) does not qualify.   Lung Cancer Screening Referral: N/A   Additional Screening:  Hepatitis C Screening: does qualify; Completed 09/22/2017  Vision Screening: Recommended annual ophthalmology exams for early detection of glaucoma and other disorders of the eye. Is the patient up to date with their annual eye exam?  Yes  Who is the provider or what is the name of the office in which the patient attends annual eye exams? [facility]  If pt is not established with a provider, would they like to be referred to a provider to establish care? Yes .   Dental Screening: Recommended annual dental exams for proper oral hygiene  Community Resource Referral / Chronic Care Management: CRR required this visit?  No   CCM required this visit?  No      Plan:     I have personally reviewed and noted the following in the patient's chart:   . Medical and social history . Use of alcohol, tobacco or illicit drugs  . Current medications and supplements . Functional ability and status . Nutritional status . Physical activity . Advanced directives . List of other physicians . Hospitalizations, surgeries, and ER visits in previous 12 months . Vitals . Screenings to include cognitive, depression, and falls . Referrals and appointments  In addition, I have reviewed and discussed with patient certain preventive protocols, quality metrics, and best practice recommendations. A written personalized care plan for preventive services as well as general preventive health recommendations were provided to patient.     Shannon Crews, LPN   03/23/2020   Nurse Notes: None      

## 2020-04-07 ENCOUNTER — Encounter: Payer: Self-pay | Admitting: Family Medicine

## 2020-04-20 ENCOUNTER — Other Ambulatory Visit: Payer: Self-pay | Admitting: Family Medicine

## 2020-06-04 NOTE — Addendum Note (Signed)
Addended by: Lerry Liner on: 06/04/2020 02:51 PM   Modules accepted: Orders

## 2020-06-23 ENCOUNTER — Other Ambulatory Visit: Payer: Medicare Other

## 2020-06-24 ENCOUNTER — Other Ambulatory Visit: Payer: Medicare Other

## 2020-06-25 ENCOUNTER — Other Ambulatory Visit: Payer: Medicare Other

## 2020-06-25 ENCOUNTER — Other Ambulatory Visit: Payer: Self-pay

## 2020-06-25 ENCOUNTER — Other Ambulatory Visit (INDEPENDENT_AMBULATORY_CARE_PROVIDER_SITE_OTHER): Payer: Medicare Other

## 2020-06-25 DIAGNOSIS — E1165 Type 2 diabetes mellitus with hyperglycemia: Secondary | ICD-10-CM | POA: Diagnosis not present

## 2020-06-25 DIAGNOSIS — Z125 Encounter for screening for malignant neoplasm of prostate: Secondary | ICD-10-CM

## 2020-06-25 DIAGNOSIS — E785 Hyperlipidemia, unspecified: Secondary | ICD-10-CM | POA: Diagnosis not present

## 2020-06-25 LAB — COMPREHENSIVE METABOLIC PANEL
ALT: 35 U/L (ref 0–53)
AST: 21 U/L (ref 0–37)
Albumin: 4.2 g/dL (ref 3.5–5.2)
Alkaline Phosphatase: 41 U/L (ref 39–117)
BUN: 14 mg/dL (ref 6–23)
CO2: 29 mEq/L (ref 19–32)
Calcium: 9.3 mg/dL (ref 8.4–10.5)
Chloride: 103 mEq/L (ref 96–112)
Creatinine, Ser: 0.77 mg/dL (ref 0.40–1.50)
GFR: 92.4 mL/min (ref >60.00)
Glucose, Bld: 112 mg/dL — ABNORMAL HIGH (ref 70–99)
Potassium: 4.3 mEq/L (ref 3.5–5.1)
Sodium: 140 mEq/L (ref 135–145)
Total Bilirubin: 1.2 mg/dL (ref 0.2–1.2)
Total Protein: 6.8 g/dL (ref 6.0–8.3)

## 2020-06-25 LAB — PSA: PSA: 0.26 ng/mL (ref 0.10–4.00)

## 2020-06-25 LAB — LIPID PANEL
Cholesterol: 143 mg/dL (ref 0–200)
HDL: 46.6 mg/dL (ref >39.00)
LDL Cholesterol: 76 mg/dL (ref 0–99)
NonHDL: 96.78
Total CHOL/HDL Ratio: 3
Triglycerides: 104 mg/dL (ref 0.0–149.0)
VLDL: 20.8 mg/dL (ref 0.0–40.0)

## 2020-06-25 LAB — HEMOGLOBIN A1C: Hgb A1c MFr Bld: 6.8 % — ABNORMAL HIGH (ref 4.6–6.5)

## 2020-07-01 ENCOUNTER — Ambulatory Visit (INDEPENDENT_AMBULATORY_CARE_PROVIDER_SITE_OTHER): Payer: Medicare Other | Admitting: Family Medicine

## 2020-07-01 ENCOUNTER — Other Ambulatory Visit: Payer: Self-pay

## 2020-07-01 ENCOUNTER — Encounter: Payer: Self-pay | Admitting: Family Medicine

## 2020-07-01 VITALS — BP 132/80 | HR 90 | Temp 98.1°F | Ht 74.0 in | Wt 211.3 lb

## 2020-07-01 DIAGNOSIS — E785 Hyperlipidemia, unspecified: Secondary | ICD-10-CM

## 2020-07-01 DIAGNOSIS — E119 Type 2 diabetes mellitus without complications: Secondary | ICD-10-CM

## 2020-07-01 DIAGNOSIS — I10 Essential (primary) hypertension: Secondary | ICD-10-CM | POA: Diagnosis not present

## 2020-07-01 NOTE — Progress Notes (Signed)
Established Patient Office Visit  Subjective:  Patient ID: Luis Ross, male    DOB: [birth year]  Age: 70 y.o. MRN: [id number]  CC:  Chief Complaint  Patient presents with  . Follow-up    3 month follow     HPI Luis Ross presents for medical follow-up.  His right hip pain and back pain have resolved at this time.  He saw neurosurgeon.  Etiology was unclear.  X-rays revealed relatively mild degenerative changes of both hips.  He did receive epidural and symptoms promptly resolved.  Ambulating without difficulty.  Remains on Invokana and Rybelsus for his diabetes.  He has lost some weight due to his efforts.  His recent A1c was 6.8% and he thinks this may have gone up some following epidural Not monitoring blood sugars regularly.  No polyuria or polydipsia.  Dyslipidemia treated with pravastatin.  Currently on 40 mg daily.  Cannot tolerate multiple other statins.  Recent LDL cholesterol 76 which is improved.  He is on low-dose losartan 25 mg daily and blood pressure is stable.  He is getting regular eye exams  Past Medical History:  Diagnosis Date  . HYPERLIPIDEMIA 02/26/2010    Past Surgical History:  Procedure Laterality Date  . GANGLION CYST EXCISION  1964   left wrist    Family History  Problem Relation Age of Onset  . Hypertension Mother   . Heart disease Mother   . Alcohol abuse Father     Social History   Socioeconomic History  . Marital status: Married    Spouse name: Not on file  . Number of children: Not on file  . Years of education: Not on file  . Highest education level: Not on file  Occupational History  . Not on file  Tobacco Use  . Smoking status: Former Smoker    Packs/day: 0.50    Years: 10.00    Pack years: 5.00    Types: Cigarettes    Quit date: 11/27/2011    Years since quitting: 8.6  . Smokeless tobacco: Never Used  Vaping Use  . Vaping Use: Never used  Substance and Sexual Activity  . Alcohol use: No    Alcohol/week: 0.0  standard drinks  . Drug use: No  . Sexual activity: Not on file  Other Topics Concern  . Not on file  Social History Narrative  . Not on file   Social Determinants of Health   Financial Resource Strain: Low Risk   . Difficulty of Paying Living Expenses: Not hard at all  Food Insecurity: No Food Insecurity  . Worried About Charity fundraiser in the Last Year: Never true  . Ran Out of Food in the Last Year: Never true  Transportation Needs: No Transportation Needs  . Lack of Transportation (Medical): No  . Lack of Transportation (Non-Medical): No  Physical Activity: Insufficiently Active  . Days of Exercise per Week: 3 days  . Minutes of Exercise per Session: 30 min  Stress: No Stress Concern Present  . Feeling of Stress : Not at all  Social Connections: Socially Integrated  . Frequency of Communication with Friends and Family: More than three times a week  . Frequency of Social Gatherings with Friends and Family: More than three times a week  . Attends Religious Services: More than 4 times per year  . Active Member of Clubs or Organizations: Yes  . Attends Archivist Meetings: More than 4 times per year  . Marital Status: Married  Intimate Partner Violence: Not At Risk  . Fear of Current or Ex-Partner: No  . Emotionally Abused: No  . Physically Abused: No  . Sexually Abused: No    Outpatient Medications Prior to Visit  Medication Sig Dispense Refill  . Blood Glucose Monitoring Suppl ([place]) w/Device KIT Use to test blood glucose 2 times daily. 1 kit 1  . canagliflozin (INVOKANA) 300 MG TABS tablet Take 1 tablet (300 mg total) by mouth daily. 90 tablet 3  . glucose blood (ONETOUCH VERIO) test strip USE AS DIRECTED TO TEST BLOOD SUGAR TWO TIMES A DAY 100 strip 3  . losartan (COZAAR) 25 MG tablet Take 1 tablet (25 mg total) by mouth daily. 90 tablet 3  . OneTouch Delica Lancets 17B MISC Use to test blood glucose 2 times daily. 100 each 3  .  pravastatin (PRAVACHOL) 40 MG tablet Take 1 tablet (40 mg total) by mouth daily. 90 tablet 3  . Semaglutide (RYBELSUS) 14 MG TABS Take 1 tablet by mouth daily. 90 tablet 3   No facility-administered medications prior to visit.    Allergies  Allergen Reactions  . Crestor [Rosuvastatin Calcium]     myalgias  . Metformin And Related Diarrhea    ROS Review of Systems  Constitutional: Negative for fatigue.  Eyes: Negative for visual disturbance.  Respiratory: Negative for cough, chest tightness and shortness of breath.   Cardiovascular: Negative for chest pain, palpitations and leg swelling.  Neurological: Negative for dizziness, syncope, weakness, light-headedness and headaches.      Objective:    Physical Exam Vitals reviewed.  Constitutional:      Appearance: Normal appearance.  Cardiovascular:     Rate and Rhythm: Normal rate and regular rhythm.  Pulmonary:     Effort: Pulmonary effort is normal.     Breath sounds: Normal breath sounds.  Skin:    Comments: Feet reveal no skin lesions. Good distal foot pulses. Good capillary refill. No calluses. Normal sensation with monofilament testing   Neurological:     Mental Status: He is alert.     BP 132/80 (BP Location: Left Arm)   Pulse 90   Temp 98.1 F (36.7 C) (Oral)   Ht 6' 2" (1.88 m)   Wt 211 lb 4.8 oz (95.8 kg)   SpO2 97%   BMI 27.13 kg/m  Wt Readings from Last 3 Encounters:  07/01/20 211 lb 4.8 oz (95.8 kg)  03/23/20 214 lb 12.3 oz (97.4 kg)  03/23/20 214 lb 12.8 oz (97.4 kg)     There are no preventive care reminders to display for this patient.  There are no preventive care reminders to display for this patient.  Lab Results  Component Value Date   TSH 1.27 02/26/2019   Lab Results  Component Value Date   WBC 4.6 09/22/2017   HGB 16.7 09/22/2017   HCT 48.6 09/22/2017   MCV 93.2 09/22/2017   PLT 202.0 09/22/2017   Lab Results  Component Value Date   NA 140 06/25/2020   K 4.3 06/25/2020    CO2 29 06/25/2020   GLUCOSE 112 (H) 06/25/2020   BUN 14 06/25/2020   CREATININE 0.77 06/25/2020   BILITOT 1.2 06/25/2020   ALKPHOS 41 06/25/2020   AST 21 06/25/2020   ALT 35 06/25/2020   PROT 6.8 06/25/2020   ALBUMIN 4.2 06/25/2020   CALCIUM 9.3 06/25/2020   GFR 92.40 06/25/2020   Lab Results  Component Value Date   CHOL 143 06/25/2020  Lab Results  Component Value Date   HDL 46.60 06/25/2020   Lab Results  Component Value Date   LDLCALC 76 06/25/2020   Lab Results  Component Value Date   TRIG 104.0 06/25/2020   Lab Results  Component Value Date   CHOLHDL 3 06/25/2020   Lab Results  Component Value Date   HGBA1C 6.8 (H) 06/25/2020      Assessment & Plan:   Problem List Items Addressed This Visit      Unprioritized   Essential hypertension - Primary   Relevant Orders   CMP   Hyperlipidemia   Relevant Orders   Lipid panel   Type 2 diabetes mellitus ([place])   Relevant Orders   Hemoglobin A1c    -Medical problems stable.  Reviewed recent labs.  Lipids are actually improved.  A1c 6.8%.  -Continue weight loss/weight maintenance efforts and regular exercise and will plan 63-monthfollow-up.  No orders of the defined types were placed in this encounter.   Follow-up: No follow-ups on file.    BCarolann Littler MD

## 2020-07-31 ENCOUNTER — Other Ambulatory Visit: Payer: Self-pay | Admitting: Family Medicine

## 2020-08-29 ENCOUNTER — Other Ambulatory Visit: Payer: Self-pay | Admitting: Family Medicine

## 2020-10-02 ENCOUNTER — Other Ambulatory Visit: Payer: Medicare Other

## 2020-10-07 ENCOUNTER — Other Ambulatory Visit (INDEPENDENT_AMBULATORY_CARE_PROVIDER_SITE_OTHER): Payer: Medicare Other

## 2020-10-07 ENCOUNTER — Other Ambulatory Visit: Payer: Self-pay

## 2020-10-07 DIAGNOSIS — E119 Type 2 diabetes mellitus without complications: Secondary | ICD-10-CM | POA: Diagnosis not present

## 2020-10-07 DIAGNOSIS — I10 Essential (primary) hypertension: Secondary | ICD-10-CM | POA: Diagnosis not present

## 2020-10-07 DIAGNOSIS — E785 Hyperlipidemia, unspecified: Secondary | ICD-10-CM | POA: Diagnosis not present

## 2020-10-07 LAB — LIPID PANEL
Cholesterol: 137 mg/dL (ref 0–200)
HDL: 37.3 mg/dL — ABNORMAL LOW (ref >39.00)
LDL Cholesterol: 74 mg/dL (ref 0–99)
NonHDL: 99.73
Total CHOL/HDL Ratio: 4
Triglycerides: 129 mg/dL (ref 0.0–149.0)
VLDL: 25.8 mg/dL (ref 0.0–40.0)

## 2020-10-07 LAB — HEMOGLOBIN A1C: Hgb A1c MFr Bld: 6.6 % — ABNORMAL HIGH (ref 4.6–6.5)

## 2020-10-07 LAB — COMPREHENSIVE METABOLIC PANEL
ALT: 36 U/L (ref 0–53)
AST: 22 U/L (ref 0–37)
Albumin: 4.5 g/dL (ref 3.5–5.2)
Alkaline Phosphatase: 46 U/L (ref 39–117)
BUN: 19 mg/dL (ref 6–23)
CO2: 26 mEq/L (ref 19–32)
Calcium: 9.3 mg/dL (ref 8.4–10.5)
Chloride: 102 mEq/L (ref 96–112)
Creatinine, Ser: 0.84 mg/dL (ref 0.40–1.50)
GFR: 89.83 mL/min (ref >60.00)
Glucose, Bld: 150 mg/dL — ABNORMAL HIGH (ref 70–99)
Potassium: 4.4 mEq/L (ref 3.5–5.1)
Sodium: 139 mEq/L (ref 135–145)
Total Bilirubin: 1.2 mg/dL (ref 0.2–1.2)
Total Protein: 7.1 g/dL (ref 6.0–8.3)

## 2020-10-09 ENCOUNTER — Other Ambulatory Visit: Payer: Self-pay

## 2020-10-09 ENCOUNTER — Encounter: Payer: Self-pay | Admitting: Family Medicine

## 2020-10-09 ENCOUNTER — Ambulatory Visit (INDEPENDENT_AMBULATORY_CARE_PROVIDER_SITE_OTHER): Payer: Medicare Other | Admitting: Family Medicine

## 2020-10-09 VITALS — BP 130/70 | HR 85 | Temp 98.4°F | Wt 214.5 lb

## 2020-10-09 DIAGNOSIS — E1165 Type 2 diabetes mellitus with hyperglycemia: Secondary | ICD-10-CM

## 2020-10-09 DIAGNOSIS — I10 Essential (primary) hypertension: Secondary | ICD-10-CM

## 2020-10-09 DIAGNOSIS — E785 Hyperlipidemia, unspecified: Secondary | ICD-10-CM | POA: Diagnosis not present

## 2020-10-09 NOTE — Progress Notes (Signed)
Established Patient Office Visit  Subjective:  Patient ID: Luis Ross, male    DOB: [birth year]  Age: 70 y.o. MRN: [id number]  CC:  Chief Complaint  Patient presents with   Follow-up    diabetes    HPI Luis Ross presents for medical follow-up.  He has history of dyslipidemia and type 2 diabetes.  Compliant with medications with the exception that he recently ran out of pravastatin but is back on this now.  Just returned from a trip to [place].  No specific complaints today.  Recent labs reviewed.  Chemistry stable.  Lipids stable.  A1c 6.6%.  Eye exam up-to-date.  He has had some pains from his back radiating down to both hips.  Seen neurosurgeon and had epidurals which helped briefly.  No progressive numbness or weakness.  No back pain worsening with extension.  Past Medical History:  Diagnosis Date   HYPERLIPIDEMIA 02/26/2010    Past Surgical History:  Procedure Laterality Date   GANGLION CYST EXCISION  1964   left wrist    Family History  Problem Relation Age of Onset   Hypertension Mother    Heart disease Mother    Alcohol abuse Father     Social History   Socioeconomic History   Marital status: Married    Spouse name: Not on file   Number of children: Not on file   Years of education: Not on file   Highest education level: Not on file  Occupational History   Not on file  Tobacco Use   Smoking status: Former    Packs/day: 0.50    Years: 10.00    Pack years: 5.00    Types: Cigarettes    Quit date: 11/27/2011    Years since quitting: 8.8   Smokeless tobacco: Never  Vaping Use   Vaping Use: Never used  Substance and Sexual Activity   Alcohol use: No    Alcohol/week: 0.0 standard drinks   Drug use: No   Sexual activity: Not on file  Other Topics Concern   Not on file  Social History Narrative   Not on file   Social Determinants of Health   Financial Resource Strain: Low Risk    Difficulty of Paying Living Expenses: Not hard at all  Food  Insecurity: No Food Insecurity   Worried About Charity fundraiser in the Last Year: Never true   [place] in the Last Year: Never true  Transportation Needs: No Transportation Needs   Lack of Transportation (Medical): No   Lack of Transportation (Non-Medical): No  Physical Activity: Insufficiently Active   Days of Exercise per Week: 3 days   Minutes of Exercise per Session: 30 min  Stress: No Stress Concern Present   Feeling of Stress : Not at all  Social Connections: Socially Integrated   Frequency of Communication with Friends and Family: More than three times a week   Frequency of Social Gatherings with Friends and Family: More than three times a week   Attends Religious Services: More than 4 times per year   Active Member of [employer] or Organizations: Yes   Attends Music therapist: More than 4 times per year   Marital Status: Married  Human resources officer Violence: Not At Risk   Fear of Current or Ex-Partner: No   Emotionally Abused: No   Physically Abused: No   Sexually Abused: No    Outpatient Medications Prior to Visit  Medication Sig Dispense Refill  Blood Glucose Monitoring Suppl ([place]) w/Device KIT Use to test blood glucose 2 times daily. 1 kit 1   glucose blood (ONETOUCH VERIO) test strip USE AS DIRECTED TO TEST BLOOD SUGAR TWO TIMES A DAY 100 strip 3   INVOKANA 300 MG TABS tablet TAKE ONE TABLET BY MOUTH DAILY 90 tablet 3   losartan (COZAAR) 25 MG tablet TAKE ONE TABLET BY MOUTH DAILY 90 tablet 3   OneTouch Delica Lancets 54U MISC Use to test blood glucose 2 times daily. 100 each 3   pravastatin (PRAVACHOL) 40 MG tablet Take 1 tablet (40 mg total) by mouth daily. 90 tablet 3   Semaglutide (RYBELSUS) 14 MG TABS Take 1 tablet by mouth daily. 90 tablet 3   No facility-administered medications prior to visit.    Allergies  Allergen Reactions   Crestor [Rosuvastatin Calcium]     myalgias   Metformin And Related Diarrhea     ROS Review of Systems  Constitutional:  Negative for fatigue.  Eyes:  Negative for visual disturbance.  Respiratory:  Negative for cough, chest tightness and shortness of breath.   Cardiovascular:  Negative for chest pain, palpitations and leg swelling.  Endocrine: Negative for polydipsia and polyuria.  Neurological:  Negative for dizziness, syncope, weakness, light-headedness and headaches.     Objective:      BP 130/70 (BP Location: Left Arm, Patient Position: Sitting, Cuff Size: Normal)   Pulse 85   Temp 98.4 F (36.9 C) (Oral)   Wt 214 lb 8 oz (97.3 kg)   SpO2 97%   BMI 27.54 kg/m  Wt Readings from Last 3 Encounters:  10/09/20 214 lb 8 oz (97.3 kg)  07/01/20 211 lb 4.8 oz (95.8 kg)  03/23/20 214 lb 12.3 oz (97.4 kg)     Health Maintenance Due  Topic Date Due   COVID-19 Vaccine (4 - Booster for [place] series) 05/20/2020   COLONOSCOPY (Pts 45-54yr Insurance coverage will need to be confirmed)  08/20/2020   Zoster Vaccines- Shingrix (2 of 2) 08/25/2020    There are no preventive care reminders to display for this patient.  Lab Results  Component Value Date   TSH 1.27 02/26/2019   Lab Results  Component Value Date   WBC 4.6 09/22/2017   HGB 16.7 09/22/2017   HCT 48.6 09/22/2017   MCV 93.2 09/22/2017   PLT 202.0 09/22/2017   Lab Results  Component Value Date   NA 139 10/07/2020   K 4.4 10/07/2020   CO2 26 10/07/2020   GLUCOSE 150 (H) 10/07/2020   BUN 19 10/07/2020   CREATININE 0.84 10/07/2020   BILITOT 1.2 10/07/2020   ALKPHOS 46 10/07/2020   AST 22 10/07/2020   ALT 36 10/07/2020   PROT 7.1 10/07/2020   ALBUMIN 4.5 10/07/2020   CALCIUM 9.3 10/07/2020   GFR 89.83 10/07/2020   Lab Results  Component Value Date   CHOL 137 10/07/2020   Lab Results  Component Value Date   HDL 37.30 (L) 10/07/2020   Lab Results  Component Value Date   LDLCALC 74 10/07/2020   Lab Results  Component Value Date   TRIG 129.0 10/07/2020   Lab Results   Component Value Date   CHOLHDL 4 10/07/2020   Lab Results  Component Value Date   HGBA1C 6.6 (H) 10/07/2020      Assessment & Plan:   #1 type 2 diabetes controlled with A1c 6.6%  -Continue combination therapy with Invokana and Rybelsus -Reassess in 3 months  #  2 hyperlipidemia treated with pravastatin.  Recent lipid stable. -Continue current dose of pravastatin.  He has been intolerant of multiple other statins in the past.   No orders of the defined types were placed in this encounter.   Follow-up: No follow-ups on file.    Carolann Littler, MD

## 2020-12-09 IMAGING — DX DG HIP (WITH OR WITHOUT PELVIS) 2-3V*R*
2 series · 2 of 2 positions shown · non-contrast
Comparison: None.

CLINICAL DATA: Right hip pain for several months.

EXAM:
DG HIP (WITH OR WITHOUT PELVIS) 2-3V RIGHT

[pelvis ap]
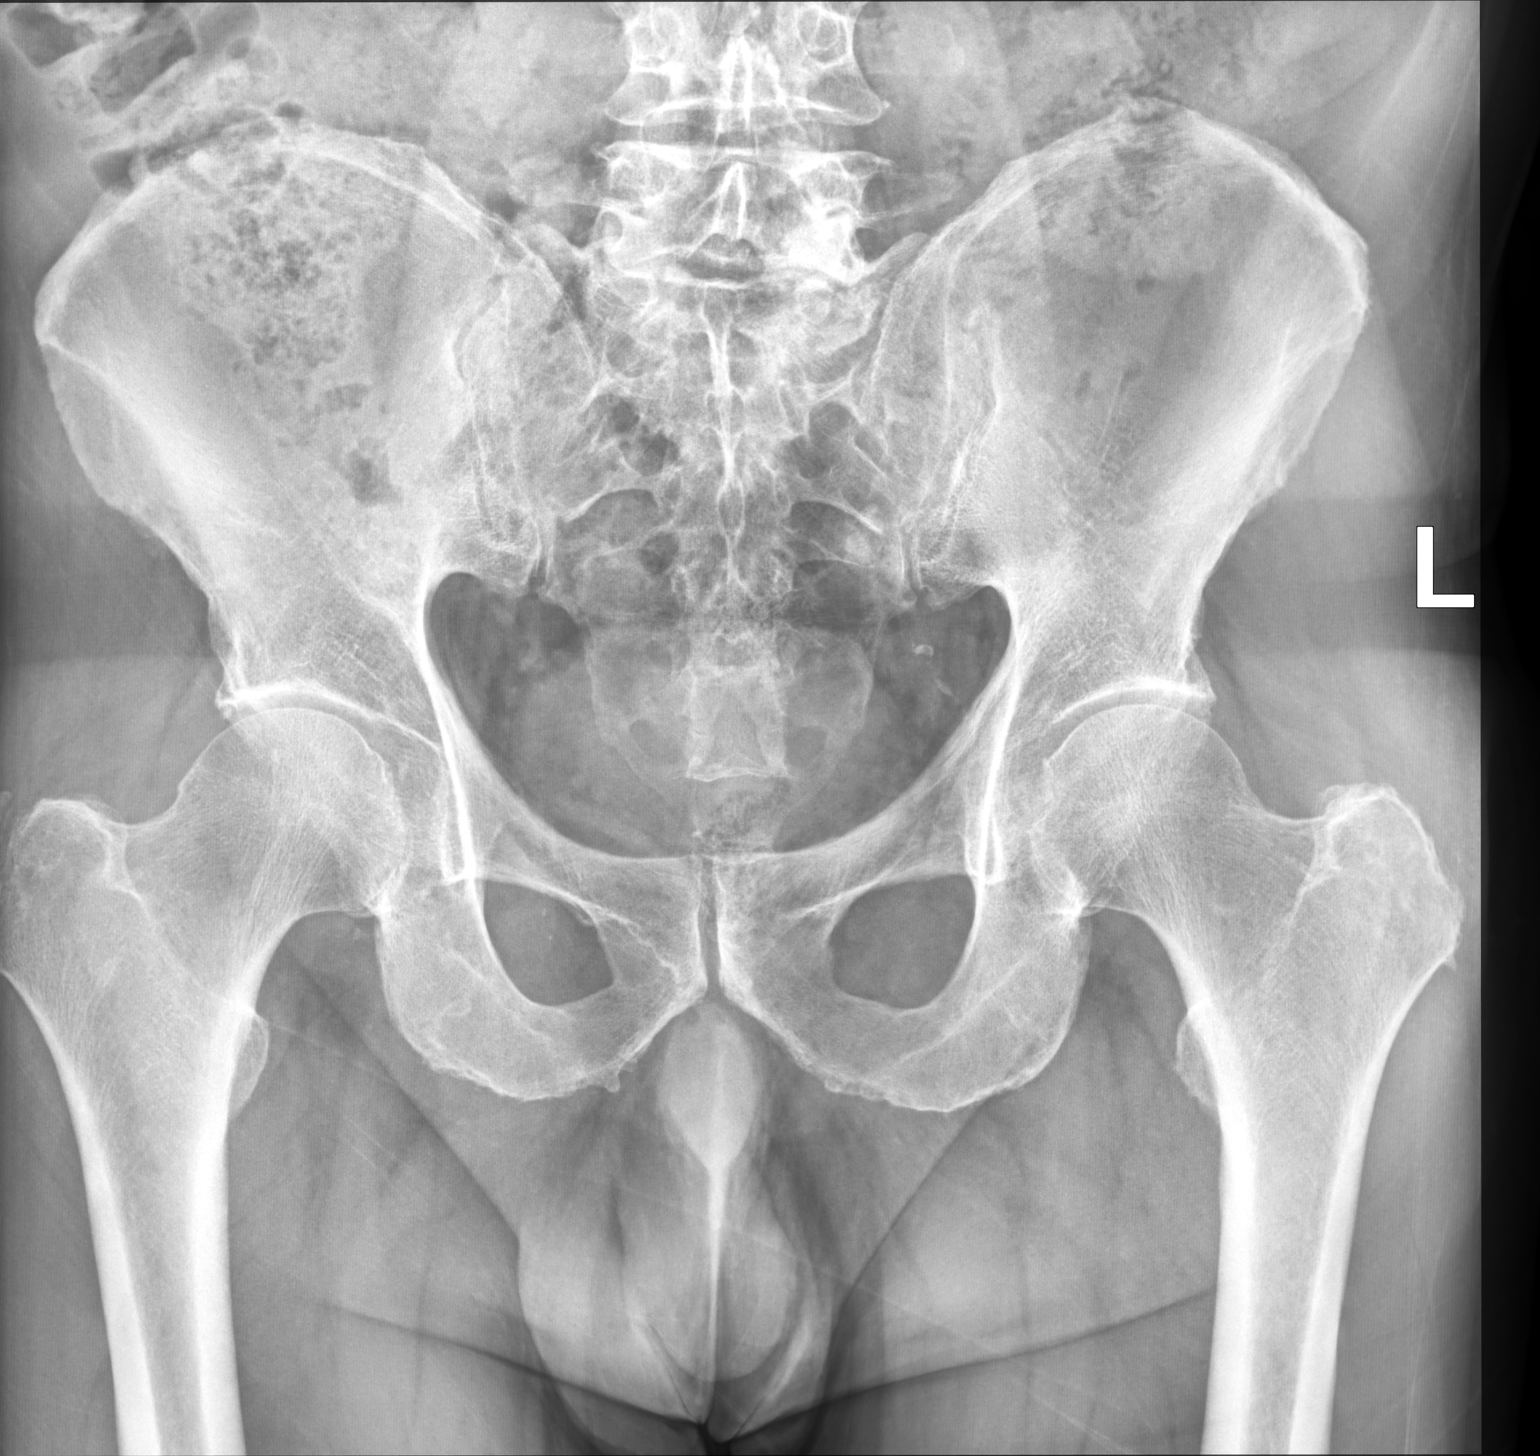

[hip (frog leg) lat]
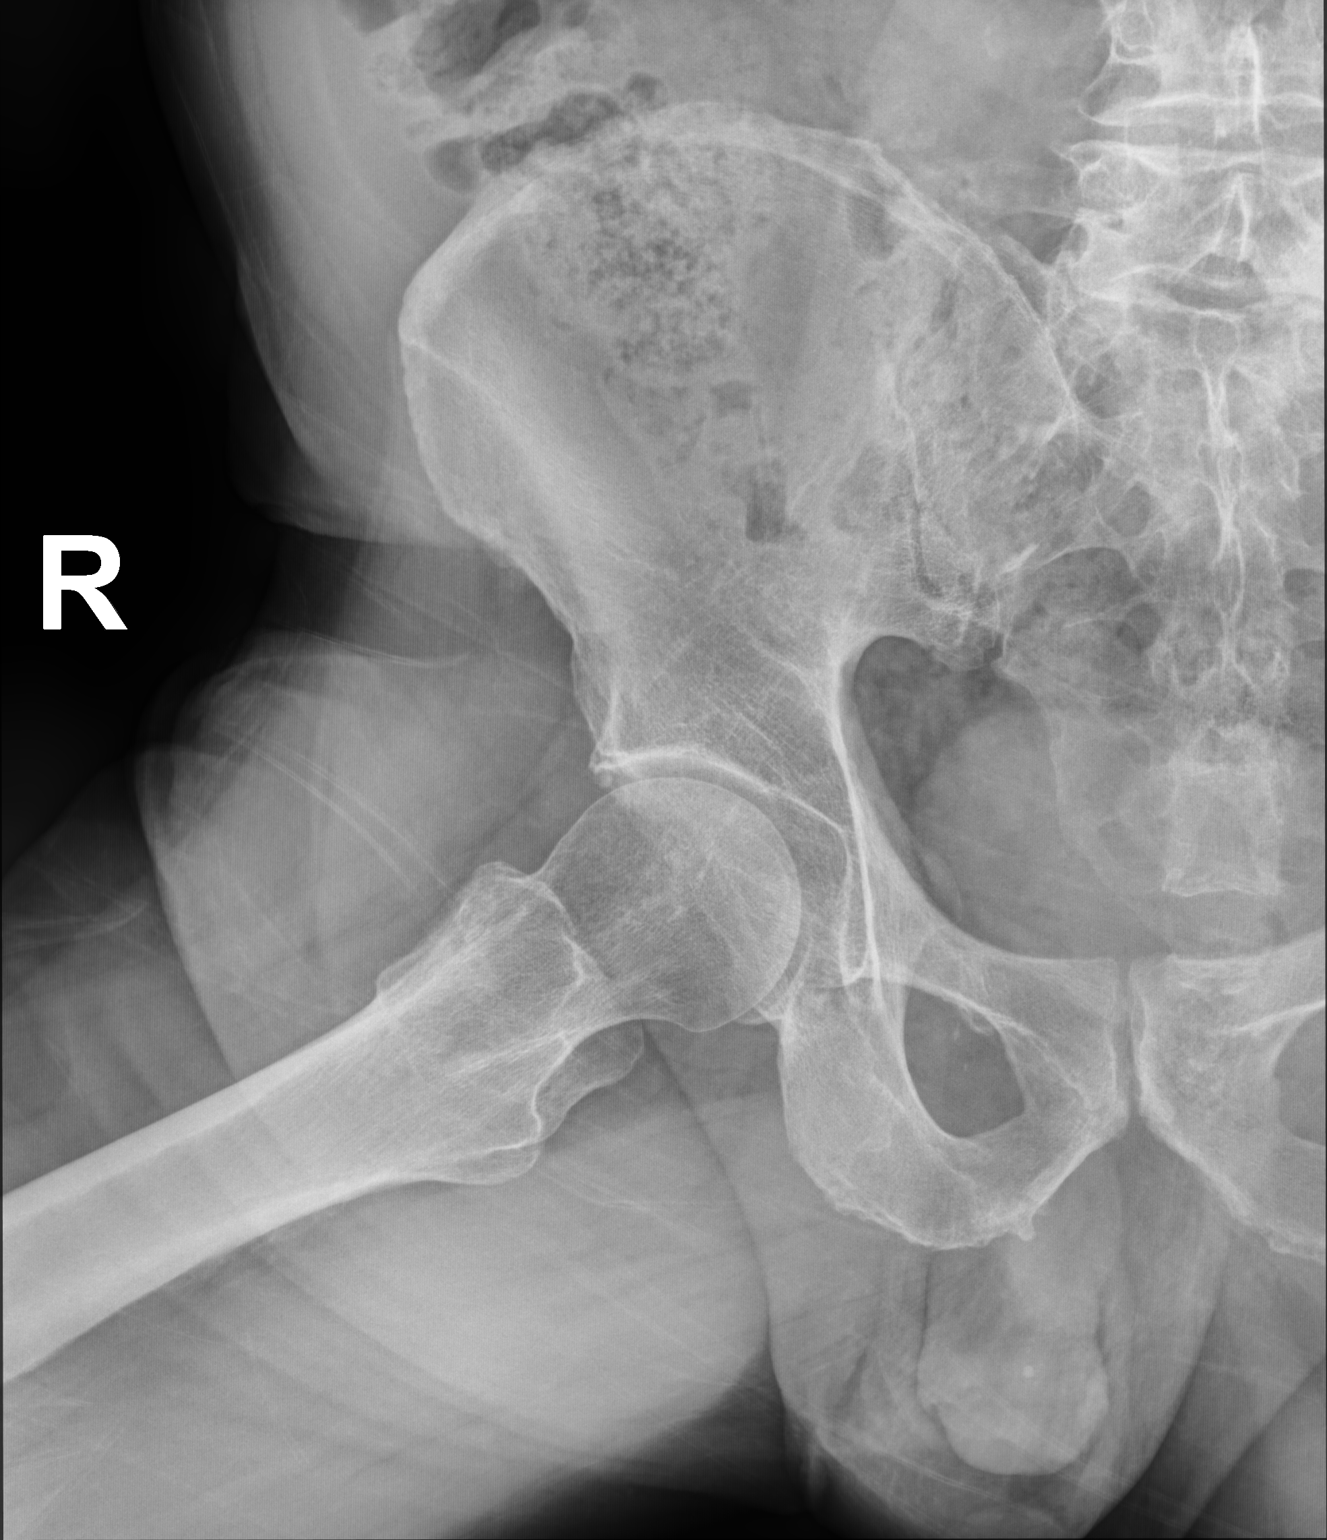

[2 of 2 positions shown; findings below may reference images not displayed]

FINDINGS: Mild bilateral hip osteoarthritis is noted with joint space
narrowing, subchondral sclerosis and marginal spur formation. No
signs of fracture or dislocation. No radio-opaque foreign bodies or
soft tissue calcifications. Degenerative disc disease noted at
L5-S1.
IMPRESSION: Mild bilateral hip osteoarthritis.

## 2020-12-14 ENCOUNTER — Other Ambulatory Visit: Payer: Self-pay | Admitting: Family Medicine

## 2021-01-11 ENCOUNTER — Other Ambulatory Visit (INDEPENDENT_AMBULATORY_CARE_PROVIDER_SITE_OTHER): Payer: Medicare Other

## 2021-01-11 ENCOUNTER — Encounter: Payer: Self-pay | Admitting: Family Medicine

## 2021-01-11 ENCOUNTER — Other Ambulatory Visit: Payer: Self-pay

## 2021-01-11 DIAGNOSIS — I10 Essential (primary) hypertension: Secondary | ICD-10-CM | POA: Diagnosis not present

## 2021-01-11 DIAGNOSIS — E785 Hyperlipidemia, unspecified: Secondary | ICD-10-CM

## 2021-01-11 LAB — COMPREHENSIVE METABOLIC PANEL
ALT: 27 U/L (ref 0–53)
AST: 21 U/L (ref 0–37)
Albumin: 4.4 g/dL (ref 3.5–5.2)
Alkaline Phosphatase: 45 U/L (ref 39–117)
BUN: 18 mg/dL (ref 6–23)
CO2: 26 mEq/L (ref 19–32)
Calcium: 9.4 mg/dL (ref 8.4–10.5)
Chloride: 102 mEq/L (ref 96–112)
Creatinine, Ser: 0.93 mg/dL (ref 0.40–1.50)
GFR: 84.42 mL/min (ref >60.00)
Glucose, Bld: 136 mg/dL — ABNORMAL HIGH (ref 70–99)
Potassium: 4.1 mEq/L (ref 3.5–5.1)
Sodium: 139 mEq/L (ref 135–145)
Total Bilirubin: 0.7 mg/dL (ref 0.2–1.2)
Total Protein: 7.3 g/dL (ref 6.0–8.3)

## 2021-01-11 LAB — LIPID PANEL
Cholesterol: 122 mg/dL (ref 0–200)
HDL: 37.2 mg/dL — ABNORMAL LOW (ref >39.00)
LDL Cholesterol: 61 mg/dL (ref 0–99)
NonHDL: 84.45
Total CHOL/HDL Ratio: 3
Triglycerides: 119 mg/dL (ref 0.0–149.0)
VLDL: 23.8 mg/dL (ref 0.0–40.0)

## 2021-01-12 NOTE — Telephone Encounter (Signed)
Pt A1c noted to be 6.6 in June. Pt called & discussed needing to come in for additional lab work since it can't be included in the blood work that he had done on 9/19. Pt state he typically has A1c drawn Q6mo & forgot to ask for it to be added. Pt states he has an appt coming up with PCP soon & will discuss getting the lab collected at that time.

## 2021-01-15 ENCOUNTER — Ambulatory Visit: Payer: Medicare Other | Admitting: Family Medicine

## 2021-01-29 ENCOUNTER — Ambulatory Visit (INDEPENDENT_AMBULATORY_CARE_PROVIDER_SITE_OTHER): Payer: Medicare Other | Admitting: Family Medicine

## 2021-01-29 ENCOUNTER — Other Ambulatory Visit: Payer: Self-pay

## 2021-01-29 VITALS — BP 124/64 | HR 98 | Temp 98.5°F | Wt 217.7 lb

## 2021-01-29 DIAGNOSIS — E785 Hyperlipidemia, unspecified: Secondary | ICD-10-CM

## 2021-01-29 DIAGNOSIS — S39012A Strain of muscle, fascia and tendon of lower back, initial encounter: Secondary | ICD-10-CM

## 2021-01-29 DIAGNOSIS — Z23 Encounter for immunization: Secondary | ICD-10-CM | POA: Diagnosis not present

## 2021-01-29 DIAGNOSIS — I10 Essential (primary) hypertension: Secondary | ICD-10-CM | POA: Diagnosis not present

## 2021-01-29 DIAGNOSIS — E1165 Type 2 diabetes mellitus with hyperglycemia: Secondary | ICD-10-CM

## 2021-01-29 LAB — POCT GLYCOSYLATED HEMOGLOBIN (HGB A1C): Hemoglobin A1C: 6.6 % — AB (ref 4.0–5.6)

## 2021-01-29 MED ORDER — CYCLOBENZAPRINE HCL 10 MG PO TABS: 10.0000 mg | ORAL_TABLET | Freq: Three times a day (TID) | ORAL | 1 refills | Status: DC | PRN

## 2021-01-29 NOTE — Progress Notes (Signed)
Established Patient Office Visit  Subjective:  Patient ID: Luis Ross, male    DOB: 1950-06-19  Age: 70 y.o. MRN: [id number]  CC:  Chief Complaint  Patient presents with   Follow-up    HPI [name] presents for medical follow-up.  He just had repeat colonoscopy in September.  We had not received records of that yet.  He does need flu vaccine.  His daughter and son-in-law are looking at adopting soon.  He would like to go ahead with Tdap.  Last tetanus reportedly 2014.  Recently agitated his back.  He done some lifting of fairly heavy plywood.  Had some leftover Flexeril which helped some.  He feels like this is more of muscle spasm issue.  Requesting refills of Flexeril.  No radiculitis symptoms.  Recent labs reviewed.  His lipids were stable.  Renal function stable.  Chemistries all stable.  He remains on Rybelsus and Invokana for his blood sugars.  No polyuria or polydipsia.  Past Medical History:  Diagnosis Date   HYPERLIPIDEMIA 02/26/2010    Past Surgical History:  Procedure Laterality Date   GANGLION CYST EXCISION  1964   left wrist    Family History  Problem Relation Age of Onset   Hypertension Mother    Heart disease Mother    Alcohol abuse Father     Social History   Socioeconomic History   Marital status: Married    Spouse name: Not on file   Number of children: Not on file   Years of education: Not on file   Highest education level: Not on file  Occupational History   Not on file  Tobacco Use   Smoking status: Former    Packs/day: 0.50    Years: 10.00    Pack years: 5.00    Types: Cigarettes    Quit date: 11/27/2011    Years since quitting: 9.1   Smokeless tobacco: Never  Vaping Use   Vaping Use: Never used  Substance and Sexual Activity   Alcohol use: No    Alcohol/week: 0.0 standard drinks   Drug use: No   Sexual activity: Not on file  Other Topics Concern   Not on file  Social History Narrative   Not on file   Social  Determinants of Health   Financial Resource Strain: Low Risk    Difficulty of Paying Living Expenses: Not hard at all  Food Insecurity: No Food Insecurity   Worried About Programme researcher, broadcasting/film/video in the Last Year: Never true   Ran Out of Food in the Last Year: Never true  Transportation Needs: No Transportation Needs   Lack of Transportation (Medical): No   Lack of Transportation (Non-Medical): No  Physical Activity: Insufficiently Active   Days of Exercise per Week: 3 days   Minutes of Exercise per Session: 30 min  Stress: No Stress Concern Present   Feeling of Stress : Not at all  Social Connections: Socially Integrated   Frequency of Communication with Friends and Family: More than three times a week   Frequency of Social Gatherings with Friends and Family: More than three times a week   Attends Religious Services: More than 4 times per year   Active Member of [employer] or Organizations: Yes   Attends Engineer, structural: More than 4 times per year   Marital Status: Married  Catering manager Violence: Not At Risk   Fear of Current or Ex-Partner: No   Emotionally Abused: No   Physically  Abused: No   Sexually Abused: No    Outpatient Medications Prior to Visit  Medication Sig Dispense Refill   Blood Glucose Monitoring Suppl ([place]) w/Device KIT Use to test blood glucose 2 times daily. 1 kit 1   glucose blood (ONETOUCH VERIO) test strip USE AS DIRECTED TO TEST BLOOD SUGAR TWO TIMES A DAY 100 strip 3   INVOKANA 300 MG TABS tablet TAKE ONE TABLET BY MOUTH DAILY 90 tablet 3   losartan (COZAAR) 25 MG tablet TAKE ONE TABLET BY MOUTH DAILY 90 tablet 3   OneTouch Delica Lancets 14D MISC Use to test blood glucose 2 times daily. 100 each 3   pravastatin (PRAVACHOL) 40 MG tablet Take 1 tablet (40 mg total) by mouth daily. 90 tablet 3   RYBELSUS 14 MG TABS TAKE ONE TABLET BY MOUTH DAILY 90 tablet 3   No facility-administered medications prior to visit.     Allergies  Allergen Reactions   Crestor [Rosuvastatin Calcium]     myalgias   Metformin And Related Diarrhea    ROS Review of Systems  Constitutional:  Negative for fatigue and unexpected weight change.  Eyes:  Negative for visual disturbance.  Respiratory:  Negative for cough, chest tightness and shortness of breath.   Cardiovascular:  Negative for chest pain, palpitations and leg swelling.  Endocrine: Negative for polydipsia and polyuria.  Neurological:  Negative for dizziness, syncope, weakness, light-headedness and headaches.     Objective:    Physical Exam Vitals reviewed.  Constitutional:      Appearance: Normal appearance.  Cardiovascular:     Rate and Rhythm: Normal rate and regular rhythm.  Pulmonary:     Effort: Pulmonary effort is normal.     Breath sounds: Normal breath sounds. No wheezing or rales.  Neurological:     Mental Status: He is alert.    BP 124/64 (BP Location: Left Arm, Patient Position: Sitting, Cuff Size: Normal)   Pulse 98   Temp 98.5 F (36.9 C) (Oral)   Wt 217 lb 11.2 oz (98.7 kg)   SpO2 98%   BMI 27.95 kg/m  Wt Readings from Last 3 Encounters:  01/29/21 217 lb 11.2 oz (98.7 kg)  10/09/20 214 lb 8 oz (97.3 kg)  07/01/20 211 lb 4.8 oz (95.8 kg)     There are no preventive care reminders to display for this patient.  There are no preventive care reminders to display for this patient.  Lab Results  Component Value Date   TSH 1.27 02/26/2019   Lab Results  Component Value Date   WBC 4.6 09/22/2017   HGB 16.7 09/22/2017   HCT 48.6 09/22/2017   MCV 93.2 09/22/2017   PLT 202.0 09/22/2017   Lab Results  Component Value Date   NA 139 01/11/2021   K 4.1 01/11/2021   CO2 26 01/11/2021   GLUCOSE 136 (H) 01/11/2021   BUN 18 01/11/2021   CREATININE 0.93 01/11/2021   BILITOT 0.7 01/11/2021   ALKPHOS 45 01/11/2021   AST 21 01/11/2021   ALT 27 01/11/2021   PROT 7.3 01/11/2021   ALBUMIN 4.4 01/11/2021   CALCIUM 9.4  01/11/2021   GFR 84.42 01/11/2021   Lab Results  Component Value Date   CHOL 122 01/11/2021   Lab Results  Component Value Date   HDL 37.20 (L) 01/11/2021   Lab Results  Component Value Date   LDLCALC 61 01/11/2021   Lab Results  Component Value Date   TRIG 119.0 01/11/2021  Lab Results  Component Value Date   CHOLHDL 3 01/11/2021   Lab Results  Component Value Date   HGBA1C 6.6 (H) 10/07/2020      Assessment & Plan:   #1 type 2 diabetes well controlled with A1c today 6.6%.  Continue current regimen and reassess in 3 months  #2 dyslipidemia.  Recent LDL cholesterol 61.  Patient on pravastatin.  Previous intolerance with other statins.  Continue pravastatin at current dosage.  #3 recent low back strain.  Refilled Flexeril 10 mg take 1 every 8 hours as needed for muscle spasm  #4 health maintenance-flu vaccine given.  Tdap booster given.  Recent colonoscopy as above.   Meds ordered this encounter  Medications   cyclobenzaprine (FLEXERIL) 10 MG tablet    Sig: Take 1 tablet (10 mg total) by mouth 3 (three) times daily as needed for muscle spasms.    Dispense:  30 tablet    Refill:  1    Follow-up: No follow-ups on file.    Carolann Littler, MD

## 2021-03-24 ENCOUNTER — Ambulatory Visit (INDEPENDENT_AMBULATORY_CARE_PROVIDER_SITE_OTHER): Payer: Medicare Other

## 2021-03-24 VITALS — Ht 74.0 in | Wt 217.0 lb

## 2021-03-24 DIAGNOSIS — Z0000 Encounter for general adult medical examination without abnormal findings: Secondary | ICD-10-CM | POA: Diagnosis not present

## 2021-03-24 NOTE — Patient Instructions (Addendum)
Luis Ross , Thank you for taking time to come for your Medicare Wellness Visit. I appreciate your ongoing commitment to your health goals. Please review the following plan we discussed and let me know if I can assist you in the future.   These are the goals we discussed:  Goals      Exercise 150 min/wk Moderate Activity        This is a list of the screening recommended for you and due dates:  Health Maintenance  Topic Date Due   Pneumonia Vaccine (3 - PPSV23 if available, else PCV20) 01/28/2020   Eye exam for diabetics  03/24/2021*   Complete foot exam   07/01/2021   Hemoglobin A1C  07/30/2021   Colon Cancer Screening  01/06/2026   Tetanus Vaccine  01/30/2031   COVID-19 Vaccine  Completed   Hepatitis C Screening: USPSTF Recommendation to screen - Ages 18-79 yo.  Completed   Zoster (Shingles) Vaccine  Completed   HPV Vaccine  Aged Out   Flu Shot  Discontinued  *Topic was postponed. The date shown is not the original due date.   Advanced directives: Yes  Conditions/risks identified: None  Next appointment: Follow up in one year for your annual wellness visit.   Preventive Care 50 Years and Older, Male Preventive care refers to lifestyle choices and visits with your health care provider that can promote health and wellness. What does preventive care include? A yearly physical exam. This is also called an annual well check. Dental exams once or twice a year. Routine eye exams. Ask your health care provider how often you should have your eyes checked. Personal lifestyle choices, including: Daily care of your teeth and gums. Regular physical activity. Eating a healthy diet. Avoiding tobacco and drug use. Limiting alcohol use. Practicing safe sex. Taking low doses of aspirin every day. Taking vitamin and mineral supplements as recommended by your health care provider. What happens during an annual well check? The services and screenings done by your health care provider  during your annual well check will depend on your age, overall health, lifestyle risk factors, and family history of disease. Counseling  Your health care provider may ask you questions about your: Alcohol use. Tobacco use. Drug use. Emotional well-being. Home and relationship well-being. Sexual activity. Eating habits. History of falls. Memory and ability to understand (cognition). Work and work Astronomer. Screening  You may have the following tests or measurements: Height, weight, and BMI. Blood pressure. Lipid and cholesterol levels. These may be checked every 5 years, or more frequently if you are over 43 years old. Skin check. Lung cancer screening. You may have this screening every year starting at age 52 if you have a 30-pack-year history of smoking and currently smoke or have quit within the past 15 years. Fecal occult blood test (FOBT) of the stool. You may have this test every year starting at age 67. Flexible sigmoidoscopy or colonoscopy. You may have a sigmoidoscopy every 5 years or a colonoscopy every 10 years starting at age 36. Prostate cancer screening. Recommendations will vary depending on your family history and other risks. Hepatitis C blood test. Hepatitis B blood test. Sexually transmitted disease (STD) testing. Diabetes screening. This is done by checking your blood sugar (glucose) after you have not eaten for a while (fasting). You may have this done every 1-3 years. Abdominal aortic aneurysm (AAA) screening. You may need this if you are a current or former smoker. Osteoporosis. You may be screened starting at  age 34 if you are at high risk. Talk with your health care provider about your test results, treatment options, and if necessary, the need for more tests. Vaccines  Your health care provider may recommend certain vaccines, such as: Influenza vaccine. This is recommended every year. Tetanus, diphtheria, and acellular pertussis (Tdap, Td) vaccine. You  may need a Td booster every 10 years. Zoster vaccine. You may need this after age 27. Pneumococcal 13-valent conjugate (PCV13) vaccine. One dose is recommended after age 67. Pneumococcal polysaccharide (PPSV23) vaccine. One dose is recommended after age 84. Talk to your health care provider about which screenings and vaccines you need and how often you need them. This information is not intended to replace advice given to you by your health care provider. Make sure you discuss any questions you have with your health care provider. Document Released: 05/08/2015 Document Revised: 12/30/2015 Document Reviewed: 02/10/2015 Elsevier Interactive Patient Education  2017 [place] Prevention in the Home Falls can cause injuries. They can happen to people of all ages. There are many things you can do to make your home safe and to help prevent falls. What can I do on the outside of my home? Regularly fix the edges of walkways and driveways and fix any cracks. Remove anything that might make you trip as you walk through a door, such as a raised step or threshold. Trim any bushes or trees on the path to your home. Use bright outdoor lighting. Clear any walking paths of anything that might make someone trip, such as rocks or tools. Regularly check to see if handrails are loose or broken. Make sure that both sides of any steps have handrails. Any raised decks and porches should have guardrails on the edges. Have any leaves, snow, or ice cleared regularly. Use sand or salt on walking paths during winter. Clean up any spills in your garage right away. This includes oil or grease spills. What can I do in the bathroom? Use night lights. Install grab bars by the toilet and in the tub and shower. Do not use towel bars as grab bars. Use non-skid mats or decals in the tub or shower. If you need to sit down in the shower, use a plastic, non-slip stool. Keep the floor dry. Clean up any water that spills  on the floor as soon as it happens. Remove soap buildup in the tub or shower regularly. Attach bath mats securely with double-sided non-slip rug tape. Do not have throw rugs and other things on the floor that can make you trip. What can I do in the bedroom? Use night lights. Make sure that you have a light by your bed that is easy to reach. Do not use any sheets or blankets that are too big for your bed. They should not hang down onto the floor. Have a firm chair that has side arms. You can use this for support while you get dressed. Do not have throw rugs and other things on the floor that can make you trip. What can I do in the kitchen? Clean up any spills right away. Avoid walking on wet floors. Keep items that you use a lot in easy-to-reach places. If you need to reach something above you, use a strong step stool that has a grab bar. Keep electrical cords out of the way. Do not use floor polish or wax that makes floors slippery. If you must use wax, use non-skid floor wax. Do not have throw rugs and  other things on the floor that can make you trip. What can I do with my stairs? Do not leave any items on the stairs. Make sure that there are handrails on both sides of the stairs and use them. Fix handrails that are broken or loose. Make sure that handrails are as long as the stairways. Check any carpeting to make sure that it is firmly attached to the stairs. Fix any carpet that is loose or worn. Avoid having throw rugs at the top or bottom of the stairs. If you do have throw rugs, attach them to the floor with carpet tape. Make sure that you have a light switch at the top of the stairs and the bottom of the stairs. If you do not have them, ask someone to add them for you. What else can I do to help prevent falls? Wear shoes that: Do not have high heels. Have rubber bottoms. Are comfortable and fit you well. Are closed at the toe. Do not wear sandals. If you use a stepladder: Make  sure that it is fully opened. Do not climb a closed stepladder. Make sure that both sides of the stepladder are locked into place. Ask someone to hold it for you, if possible. Clearly mark and make sure that you can see: Any grab bars or handrails. First and last steps. Where the edge of each step is. Use tools that help you move around (mobility aids) if they are needed. These include: Canes. Walkers. Scooters. Crutches. Turn on the lights when you go into a dark area. Replace any light bulbs as soon as they burn out. Set up your furniture so you have a clear path. Avoid moving your furniture around. If any of your floors are uneven, fix them. If there are any pets around you, be aware of where they are. Review your medicines with your doctor. Some medicines can make you feel dizzy. This can increase your chance of falling. Ask your doctor what other things that you can do to help prevent falls. This information is not intended to replace advice given to you by your health care provider. Make sure you discuss any questions you have with your health care provider. Document Released: 02/05/2009 Document Revised: 09/17/2015 Document Reviewed: 05/16/2014 Elsevier Interactive Patient Education  2017 [employer].

## 2021-03-24 NOTE — Progress Notes (Signed)
Subjective:   Luis Ross is a 70 y.o. male who presents for Medicare Annual/Subsequent preventive examination.  Review of Systems    No ROS.  Cardiac Risk Factors include: advanced age (>19mn, >>64women);hypertension;diabetes mellitus     Objective:    Today's Vitals   03/24/21 0815  Weight: 217 lb (98.4 kg)  Height: '6\' 2"'  (1.88 m)   Body mass index is 27.86 kg/m.  Advanced Directives 03/24/2021 03/23/2020 10/10/2019  Does Patient Have a Medical Advance Directive? Yes No;Yes Yes  Type of [place] will [place] will [place] Does patient want to make changes to medical advance directive? No - Patient declined No - Patient declined -  Copy of HArnoldin Chart? No - copy requested No - copy requested No - copy requested    Current Medications (verified) Outpatient Encounter Medications as of 03/24/2021  Medication Sig   Blood Glucose Monitoring Suppl ([place] w/Device KIT Use to test blood glucose 2 times daily.   cyclobenzaprine (FLEXERIL) 10 MG tablet Take 1 tablet (10 mg total) by mouth 3 (three) times daily as needed for muscle spasms.   glucose blood (ONETOUCH VERIO) test strip USE AS DIRECTED TO TEST BLOOD SUGAR TWO TIMES A DAY   INVOKANA 300 MG TABS tablet TAKE ONE TABLET BY MOUTH DAILY   losartan (COZAAR) 25 MG tablet TAKE ONE TABLET BY MOUTH DAILY   OneTouch Delica Lancets 371IMISC Use to test blood glucose 2 times daily.   pravastatin (PRAVACHOL) 40 MG tablet Take 1 tablet (40 mg total) by mouth daily.   RYBELSUS 14 MG TABS TAKE ONE TABLET BY MOUTH DAILY   No facility-administered encounter medications on file as of 03/24/2021.    Allergies (verified) Crestor [rosuvastatin calcium] and Metformin and related   History: Past Medical History:  Diagnosis Date   HYPERLIPIDEMIA 02/26/2010   Past Surgical History:  Procedure  Laterality Date   GANGLION CYST EXCISION  1964   left wrist   Family History  Problem Relation Age of Onset   Hypertension Mother    Heart disease Mother    Alcohol abuse Father    Social History   Socioeconomic History   Marital status: Married    Spouse name: Not on file   Number of children: Not on file   Years of education: Not on file   Highest education level: Not on file  Occupational History   Not on file  Tobacco Use   Smoking status: Former    Packs/day: 0.50    Years: 10.00    Pack years: 5.00    Types: Cigarettes    Quit date: 11/27/2011    Years since quitting: 9.3   Smokeless tobacco: Never  Vaping Use   Vaping Use: Never used  Substance and Sexual Activity   Alcohol use: No    Alcohol/week: 0.0 standard drinks   Drug use: No   Sexual activity: Not on file  Other Topics Concern   Not on file  Social History Narrative   Not on file   Social Determinants of Health   Financial Resource Strain: Low Risk    Difficulty of Paying Living Expenses: Not very hard  Food Insecurity: No Food Insecurity   Worried About Running Out of Food in the Last Year: Never true   RE[place] the Last Year: Never true  Transportation Needs: No Transportation Needs  Lack of Transportation (Medical): No   Lack of Transportation (Non-Medical): No  Physical Activity: Insufficiently Active   Days of Exercise per Week: 3 days   Minutes of Exercise per Session: 30 min  Stress: No Stress Concern Present   Feeling of Stress : Not at all  Social Connections: Socially Integrated   Frequency of Communication with Friends and Family: More than three times a week   Frequency of Social Gatherings with Friends and Family: More than three times a week   Attends Religious Services: More than 4 times per year   Active Member of [employer] or Organizations: Yes   Attends Music therapist: More than 4 times per year   Marital Status: Married    Clinical  Intake:  Pre-visit preparation completed: Yes  How often do you need to have someone help you when you read instructions, pamphlets, or other written materials from your doctor or pharmacy?: 1 - Never  Diabetic? Yes  DIET: Regular  Nutrition Risk Assessment:  Has the patient had any N/V/D within the last 2 months?  No  Does the patient have any non-healing wounds?  No  Has the patient had any unintentional weight loss or weight gain?  No   Diabetes:  Is the patient diabetic?  Yes  If diabetic, was a CBG obtained today?  No  Did the patient bring in their glucometer from home?  No Audio Visit.  Financial Strains and Diabetes Management:  Are you having any financial strains with the device, your supplies or your medication? No .  Does the patient want to be seen by Chronic Care Management for management of their diabetes?  No  Followed by PCP.  Diabetic Exams:  Diabetic Foot Exam: Completed Yes.   Interpreter Needed?: No   Activities of Daily Living In your present state of health, do you have any difficulty performing the following activities: 03/24/2021 03/20/2021  Hearing? N N  Vision? N N  Comment Wears glasses followed by Dr Julious Oka -  Difficulty concentrating or making decisions? N N  Walking or climbing stairs? N N  Dressing or bathing? N N  Doing errands, shopping? N N  Preparing Food and eating ? N N  Using the Toilet? N N  In the past six months, have you accidently leaked urine? N N  Do you have problems with loss of bowel control? N N  Managing your Medications? N N  Managing your Finances? N N  Housekeeping or managing your Housekeeping? N N  Some recent data might be hidden    Patient Care Team: Eulas Post, MD as PCP - General  Indicate any recent Medical Services you may have received from other than [facility] providers in the past year (date may be approximate).     Assessment:   This is a routine wellness examination for Luis Ross.Virtual  Visit via Telephone Note  I connected with  Luis Ross on 03/24/21 at  8:15 AM EST by telephone and verified that I am speaking with the correct person using two identifiers.  Location: Patient: Home Provider: Office Persons participating in the virtual visit: patient/Nurse Health Advisor   I discussed the limitations, risks, security and privacy concerns of performing an evaluation and management service by telephone and the availability of in person appointments. The patient expressed understanding and agreed to proceed.  Interactive audio and video telecommunications were attempted between this nurse and patient, however failed, due to patient having technical difficulties OR patient did not  have access to video capability.  We continued and completed visit with audio only.  Some vital signs may be absent or patient reported.   [name], LPN   Hearing/Vision screen Hearing Screening - Comments:: No difficulty hearing Vision Screening - Comments:: Wears glasses. Followed by Dr Julious Oka  Dietary issues and exercise activities discussed: Current Exercise Habits: Home exercise routine, Type of exercise: walking, Time (Minutes): 30, Frequency (Times/Week): 3, Weekly Exercise (Minutes/Week): 90, Intensity: Moderate   Goals Addressed             This Visit's Progress    Exercise 150 min/wk Moderate Activity         Depression Screen PHQ 2/9 Scores 03/24/2021 03/23/2020 03/23/2020 09/09/2019 09/29/2017  PHQ - 2 Score 0 0 0 0 0  PHQ- 9 Score - 0 - - -    Fall Risk Fall Risk  03/24/2021 03/20/2021 03/23/2020 03/23/2020 09/09/2019  Falls in the past year? 0 0 0 0 0  Number falls in past yr: 0 0 0 - 0  Injury with Fall? 0 0 0 - 0  Risk for fall due to : - - No Fall Risks - -  Follow up - - Falls evaluation completed;Falls prevention discussed - Falls evaluation completed    FALL RISK PREVENTION PERTAINING TO THE HOME:  Any stairs in or around the home? Yes  If so,  are there any without handrails? No  Home free of loose throw rugs in walkways, pet beds, electrical cords, etc? Yes. Adequate lighting in your home to reduce risk of falls? Yes   ASSISTIVE DEVICES UTILIZED TO PREVENT FALLS:  Life alert? No  Use of a cane, walker or w/c? No  Grab bars in the bathroom? Yes  Shower chair or bench in shower? No  Elevated toilet seat or a handicapped toilet? Yes   TIMED UP AND GO:  Was the test performed? No . Audio Visit  Cognitive Function:     6CIT Screen 03/24/2021  What Year? 0 points  What month? 0 points  What time? 0 points  Count back from 20 0 points  Months in reverse 0 points  Repeat phrase 0 points  Total Score 0    Immunizations Immunization History  Administered Date(s) Administered   Fluad Quad(high Dose 65+) 01/29/2021   Influenza, High Dose Seasonal PF 01/30/2019   Influenza, Seasonal, Injecte, Preservative Fre 02/11/2014, 02/05/2018   Influenza-Unspecified 01/24/2015, 01/24/2016, 02/01/2017   PFIZER(Purple Top)SARS-COV-2 Vaccination 05/23/2019, 06/03/2019, 06/20/2019, 01/19/2020, 07/25/2020, 01/08/2021   Pneumococcal Conjugate-13 01/24/2015, 02/05/2018   Pneumococcal Polysaccharide-23 01/28/2015   Td 04/26/2003   Tdap 04/09/2013, 01/29/2021   Zoster Recombinat (Shingrix) 06/30/2020, 11/10/2020    Screening Tests Health Maintenance  Topic Date Due   Pneumonia Vaccine 47+ Years old (3 - PPSV23 if available, else PCV20) 01/28/2020   OPHTHALMOLOGY EXAM  03/24/2021 (Originally 02/04/2021)   FOOT EXAM  07/01/2021   HEMOGLOBIN A1C  07/30/2021   COLONOSCOPY (Pts 45-48yr Insurance coverage will need to be confirmed)  01/06/2026   TETANUS/TDAP  01/30/2031   COVID-19 Vaccine  Completed   Hepatitis C Screening  Completed   Zoster Vaccines- Shingrix  Completed   HPV VACCINES  Aged Out   INFLUENZA VACCINE  Discontinued    Health Maintenance  Health Maintenance Due  Topic Date Due   Pneumonia Vaccine 68 Years old (3 -  PPSV23 if available, else PCV20) 01/28/2020    Vision Screening: Recommended annual ophthalmology exams for early detection of glaucoma and other disorders  of the eye. Is the patient up to date with their annual eye exam?  Yes  Who is the provider or what is the name of the office in which the patient attends annual eye exams? Followed by Dr Julious Oka.  Dental Screening: Recommended annual dental exams for proper oral hygiene  Community Resource Referral / Chronic Care Management:  CRR required this visit?  No   CCM required this visit?  No      Plan:     I have personally reviewed and noted the following in the patient's chart:   Medical and social history Use of alcohol, tobacco or illicit drugs  Current medications and supplements including opioid prescriptions. Patient is not currently taking opioid prescriptions. Functional ability and status Nutritional status Physical activity Advanced directives List of other physicians Hospitalizations, surgeries, and ER visits in previous 12 months Vitals Screenings to include cognitive, depression, and falls Referrals and appointments  In addition, I have reviewed and discussed with patient certain preventive protocols, quality metrics, and best practice recommendations. A written personalized care plan for preventive services as well as general preventive health recommendations were provided to patient.     [name], LPN   50/12/3816

## 2021-04-01 ENCOUNTER — Other Ambulatory Visit: Payer: Self-pay | Admitting: Family Medicine

## 2021-05-03 ENCOUNTER — Other Ambulatory Visit (INDEPENDENT_AMBULATORY_CARE_PROVIDER_SITE_OTHER): Payer: Medicare Other

## 2021-05-03 DIAGNOSIS — I10 Essential (primary) hypertension: Secondary | ICD-10-CM | POA: Diagnosis not present

## 2021-05-03 DIAGNOSIS — E785 Hyperlipidemia, unspecified: Secondary | ICD-10-CM

## 2021-05-03 LAB — COMPREHENSIVE METABOLIC PANEL
ALT: 42 U/L (ref 0–53)
AST: 25 U/L (ref 0–37)
Albumin: 4.6 g/dL (ref 3.5–5.2)
Alkaline Phosphatase: 44 U/L (ref 39–117)
BUN: 19 mg/dL (ref 6–23)
CO2: 25 mEq/L (ref 19–32)
Calcium: 9.3 mg/dL (ref 8.4–10.5)
Chloride: 103 mEq/L (ref 96–112)
Creatinine, Ser: 0.82 mg/dL (ref 0.40–1.50)
GFR: 90.12 mL/min (ref >60.00)
Glucose, Bld: 169 mg/dL — ABNORMAL HIGH (ref 70–99)
Potassium: 4.2 mEq/L (ref 3.5–5.1)
Sodium: 138 mEq/L (ref 135–145)
Total Bilirubin: 0.9 mg/dL (ref 0.2–1.2)
Total Protein: 7.7 g/dL (ref 6.0–8.3)

## 2021-05-03 LAB — LIPID PANEL
Cholesterol: 126 mg/dL (ref 0–200)
HDL: 37.5 mg/dL — ABNORMAL LOW (ref >39.00)
LDL Cholesterol: 61 mg/dL (ref 0–99)
NonHDL: 88.52
Total CHOL/HDL Ratio: 3
Triglycerides: 138 mg/dL (ref 0.0–149.0)
VLDL: 27.6 mg/dL (ref 0.0–40.0)

## 2021-05-11 ENCOUNTER — Ambulatory Visit (INDEPENDENT_AMBULATORY_CARE_PROVIDER_SITE_OTHER): Payer: Medicare Other | Admitting: Family Medicine

## 2021-05-11 ENCOUNTER — Ambulatory Visit: Payer: Medicare Other | Admitting: Family Medicine

## 2021-05-11 VITALS — BP 130/60 | HR 85 | Temp 98.2°F | Wt 216.8 lb

## 2021-05-11 DIAGNOSIS — E119 Type 2 diabetes mellitus without complications: Secondary | ICD-10-CM | POA: Diagnosis not present

## 2021-05-11 DIAGNOSIS — I10 Essential (primary) hypertension: Secondary | ICD-10-CM | POA: Diagnosis not present

## 2021-05-11 DIAGNOSIS — R5383 Other fatigue: Secondary | ICD-10-CM | POA: Diagnosis not present

## 2021-05-11 DIAGNOSIS — E785 Hyperlipidemia, unspecified: Secondary | ICD-10-CM | POA: Diagnosis not present

## 2021-05-11 LAB — POCT GLYCOSYLATED HEMOGLOBIN (HGB A1C): Hemoglobin A1C: 6.5 % — AB (ref 4.0–5.6)

## 2021-05-11 MED ORDER — PRAVASTATIN SODIUM 40 MG PO TABS: 40.0000 mg | ORAL_TABLET | Freq: Every day | ORAL | 3 refills | Status: DC

## 2021-05-11 MED ORDER — RYBELSUS 14 MG PO TABS: 1.0000 | ORAL_TABLET | Freq: Every day | ORAL | 3 refills | Status: DC

## 2021-05-11 MED ORDER — ONETOUCH DELICA LANCETS 33G MISC: 3 refills | Status: AC

## 2021-05-11 MED ORDER — LOSARTAN POTASSIUM 25 MG PO TABS: 25.0000 mg | ORAL_TABLET | Freq: Every day | ORAL | 3 refills | Status: DC

## 2021-05-11 MED ORDER — CANAGLIFLOZIN 300 MG PO TABS: 300.0000 mg | ORAL_TABLET | Freq: Every day | ORAL | 3 refills | Status: DC

## 2021-05-11 MED ORDER — ONETOUCH VERIO VI STRP: ORAL_STRIP | 3 refills | Status: DC

## 2021-05-11 NOTE — Progress Notes (Signed)
Established Patient Office Visit  Subjective:  Patient ID: Luis Ross, male    DOB: Sep 05, 1950  Age: 71 y.o. MRN: [id number]  CC:  Chief Complaint  Patient presents with   Follow-up    HPI Luis Ross presents for medical follow-up.  Generally doing well.  Not exercising much recently.  Does some walking a few days per week.  He has history of hypertension, dyslipidemia, type 2 diabetes.  He is switching his medications to [facility] and needs basically all of his prescription sent there.  He had recent labs.  These were reviewed.  Renal function remained stable.  Electrolytes stable.  Hepatic panel stable.  Total cholesterol 126 with triglycerides 138 HDL 37 and LDL 61.  No recent chest pains.  A1c today 6.5%.  He continues to have some chronic back pains which are daily but not limiting ADLs  Past Medical History:  Diagnosis Date   HYPERLIPIDEMIA 02/26/2010    Past Surgical History:  Procedure Laterality Date   GANGLION CYST EXCISION  1964   left wrist    Family History  Problem Relation Age of Onset   Hypertension Mother    Heart disease Mother    Alcohol abuse Father     Social History   Socioeconomic History   Marital status: Married    Spouse name: Not on file   Number of children: Not on file   Years of education: Not on file   Highest education level: Professional school degree (e.g., MD, DDS, DVM, JD)  Occupational History   Not on file  Tobacco Use   Smoking status: Former    Packs/day: 0.50    Years: 10.00    Pack years: 5.00    Types: Cigarettes    Quit date: 11/27/2011    Years since quitting: 9.4   Smokeless tobacco: Never  Vaping Use   Vaping Use: Never used  Substance and Sexual Activity   Alcohol use: No    Alcohol/week: 0.0 standard drinks   Drug use: No   Sexual activity: Not on file  Other Topics Concern   Not on file  Social History Narrative   Not on file   Social Determinants of Health   Financial Resource  Strain: Low Risk    Difficulty of Paying Living Expenses: Not hard at all  Food Insecurity: No Food Insecurity   Worried About Charity fundraiser in the Last Year: Never true   [place] in the Last Year: Never true  Transportation Needs: No Transportation Needs   Lack of Transportation (Medical): No   Lack of Transportation (Non-Medical): No  Physical Activity: Insufficiently Active   Days of Exercise per Week: 2 days   Minutes of Exercise per Session: 30 min  Stress: No Stress Concern Present   Feeling of Stress : Not at all  Social Connections: Socially Integrated   Frequency of Communication with Friends and Family: Once a week   Frequency of Social Gatherings with Friends and Family: More than three times a week   Attends Religious Services: More than 4 times per year   Active Member of [employer] or Organizations: Yes   Attends Archivist Meetings: 1 to 4 times per year   Marital Status: Married  Human resources officer Violence: Not At Risk   Fear of Current or Ex-Partner: No   Emotionally Abused: No   Physically Abused: No   Sexually Abused: No    Outpatient Medications Prior to  Visit  Medication Sig Dispense Refill   Blood Glucose Monitoring Suppl ([place]) w/Device KIT Use to test blood glucose 2 times daily. 1 kit 1   cyclobenzaprine (FLEXERIL) 10 MG tablet Take 1 tablet (10 mg total) by mouth 3 (three) times daily as needed for muscle spasms. 30 tablet 1   glucose blood (ONETOUCH VERIO) test strip USE AS DIRECTED TO TEST BLOOD SUGAR TWO TIMES A DAY 100 strip 3   INVOKANA 300 MG TABS tablet TAKE ONE TABLET BY MOUTH DAILY 90 tablet 3   losartan (COZAAR) 25 MG tablet TAKE ONE TABLET BY MOUTH DAILY 90 tablet 3   OneTouch Delica Lancets 23J MISC Use to test blood glucose 2 times daily. 100 each 3   pravastatin (PRAVACHOL) 40 MG tablet TAKE ONE TABLET BY MOUTH DAILY 90 tablet 3   RYBELSUS 14 MG TABS TAKE ONE TABLET BY MOUTH DAILY 90 tablet 3   No  facility-administered medications prior to visit.    Allergies  Allergen Reactions   Crestor [Rosuvastatin Calcium]     myalgias   Metformin And Related Diarrhea    ROS Review of Systems  Constitutional:  Negative for fatigue.  Eyes:  Negative for visual disturbance.  Respiratory:  Negative for cough, chest tightness and shortness of breath.   Cardiovascular:  Negative for chest pain, palpitations and leg swelling.  Neurological:  Negative for dizziness, syncope, weakness, light-headedness and headaches.     Objective:    Physical Exam Constitutional:      Appearance: He is well-developed.  Neck:     Thyroid: No thyromegaly.  Cardiovascular:     Rate and Rhythm: Normal rate and regular rhythm.  Pulmonary:     Effort: Pulmonary effort is normal. No respiratory distress.     Breath sounds: Normal breath sounds. No wheezing or rales.  Musculoskeletal:     Cervical back: Neck supple.  Neurological:     Mental Status: He is alert and oriented to person, place, and time.    BP 130/60 (BP Location: Left Arm, Patient Position: Sitting, Cuff Size: Normal)    Pulse 85    Temp 98.2 F (36.8 C) (Oral)    Wt 216 lb 12.8 oz (98.3 kg)    SpO2 99%    BMI 27.84 kg/m  Wt Readings from Last 3 Encounters:  05/11/21 216 lb 12.8 oz (98.3 kg)  03/24/21 217 lb (98.4 kg)  01/29/21 217 lb 11.2 oz (98.7 kg)     Health Maintenance Due  Topic Date Due   Pneumonia Vaccine 63+ Years old (3 - PPSV23 if available, else PCV20) 01/28/2020   OPHTHALMOLOGY EXAM  02/04/2021    There are no preventive care reminders to display for this patient.  Lab Results  Component Value Date   TSH 1.27 02/26/2019   Lab Results  Component Value Date   WBC 4.6 09/22/2017   HGB 16.7 09/22/2017   HCT 48.6 09/22/2017   MCV 93.2 09/22/2017   PLT 202.0 09/22/2017   Lab Results  Component Value Date   NA 138 05/03/2021   K 4.2 05/03/2021   CO2 25 05/03/2021   GLUCOSE 169 (H) 05/03/2021   BUN 19  05/03/2021   CREATININE 0.82 05/03/2021   BILITOT 0.9 05/03/2021   ALKPHOS 44 05/03/2021   AST 25 05/03/2021   ALT 42 05/03/2021   PROT 7.7 05/03/2021   ALBUMIN 4.6 05/03/2021   CALCIUM 9.3 05/03/2021   GFR 90.12 05/03/2021   Lab Results  Component  Value Date   CHOL 126 05/03/2021   Lab Results  Component Value Date   HDL 37.50 (L) 05/03/2021   Lab Results  Component Value Date   LDLCALC 61 05/03/2021   Lab Results  Component Value Date   TRIG 138.0 05/03/2021   Lab Results  Component Value Date   CHOLHDL 3 05/03/2021   Lab Results  Component Value Date   HGBA1C 6.5 (A) 05/11/2021      Assessment & Plan:   Problem List Items Addressed This Visit       Unprioritized   Essential hypertension   Relevant Medications   losartan (COZAAR) 25 MG tablet   pravastatin (PRAVACHOL) 40 MG tablet   Hyperlipidemia   Relevant Medications   losartan (COZAAR) 25 MG tablet   pravastatin (PRAVACHOL) 40 MG tablet   Other Relevant Orders   CMP   Lipid Panel   Type 2 diabetes mellitus (HCC) - Primary   Relevant Medications   canagliflozin (INVOKANA) 300 MG TABS tablet   losartan (COZAAR) 25 MG tablet   pravastatin (PRAVACHOL) 40 MG tablet   Semaglutide (RYBELSUS) 14 MG TABS   Other Relevant Orders   POCT glycosylated hemoglobin (Hb A1C) (Completed)   CMP   Other Visit Diagnoses     Fatigue, unspecified type       Relevant Orders   CBC with Differential/Platelet     -Refills for all medications sent to [facility].  These will be on file there for future refills. -Return in 3 months.  He hopes to get repeat fasting lipid and CMP in advance. -A1c 6.5% today which is stable and fairly well controlled.  Continue current medications. -Continue yearly diabetic eye exam  Meds ordered this encounter  Medications   canagliflozin (INVOKANA) 300 MG TABS tablet    Sig: Take 1 tablet (300 mg total) by mouth daily.    Dispense:  90 tablet    Refill:  3   glucose blood  (ONETOUCH VERIO) test strip    Sig: USE AS DIRECTED TO TEST BLOOD SUGAR TWO TIMES A DAY    Dispense:  100 strip    Refill:  3   OneTouch Delica Lancets 44B MISC    Sig: Use to test blood glucose 2 times daily.    Dispense:  100 each    Refill:  3   losartan (COZAAR) 25 MG tablet    Sig: Take 1 tablet (25 mg total) by mouth daily.    Dispense:  90 tablet    Refill:  3   pravastatin (PRAVACHOL) 40 MG tablet    Sig: Take 1 tablet (40 mg total) by mouth daily.    Dispense:  90 tablet    Refill:  3   Semaglutide (RYBELSUS) 14 MG TABS    Sig: Take 1 tablet by mouth daily.    Dispense:  90 tablet    Refill:  3    Follow-up: Return in about 3 months (around 08/09/2021).    Carolann Littler, MD

## 2021-07-05 LAB — HM DIABETES EYE EXAM

## 2021-07-06 ENCOUNTER — Encounter: Payer: Self-pay | Admitting: Family Medicine

## 2021-08-12 ENCOUNTER — Other Ambulatory Visit (INDEPENDENT_AMBULATORY_CARE_PROVIDER_SITE_OTHER): Payer: Medicare Other

## 2021-08-12 DIAGNOSIS — R5383 Other fatigue: Secondary | ICD-10-CM | POA: Diagnosis not present

## 2021-08-12 DIAGNOSIS — E785 Hyperlipidemia, unspecified: Secondary | ICD-10-CM | POA: Diagnosis not present

## 2021-08-12 DIAGNOSIS — E119 Type 2 diabetes mellitus without complications: Secondary | ICD-10-CM | POA: Diagnosis not present

## 2021-08-12 LAB — CBC WITH DIFFERENTIAL/PLATELET
Basophils Absolute: 0 10*3/uL (ref 0.0–0.1)
Basophils Relative: 0.7 % (ref 0.0–3.0)
Eosinophils Absolute: 0.1 10*3/uL (ref 0.0–0.7)
Eosinophils Relative: 1.5 % (ref 0.0–5.0)
HCT: 48.8 % (ref 39.0–52.0)
Hemoglobin: 16.6 g/dL (ref 13.0–17.0)
Lymphocytes Relative: 35.6 % (ref 12.0–46.0)
Lymphs Abs: 2.1 10*3/uL (ref 0.7–4.0)
MCHC: 34.1 g/dL (ref 30.0–36.0)
MCV: 93.4 fl (ref 78.0–100.0)
Monocytes Absolute: 0.6 10*3/uL (ref 0.1–1.0)
Monocytes Relative: 10.9 % (ref 3.0–12.0)
Neutro Abs: 3 10*3/uL (ref 1.4–7.7)
Neutrophils Relative %: 51.3 % (ref 43.0–77.0)
Platelets: 192 10*3/uL (ref 150.0–400.0)
RBC: 5.23 Mil/uL (ref 4.22–5.81)
RDW: 12.9 % (ref 11.5–15.5)
WBC: 5.9 10*3/uL (ref 4.0–10.5)

## 2021-08-12 LAB — LIPID PANEL
Cholesterol: 117 mg/dL (ref 0–200)
HDL: 46.1 mg/dL (ref >39.00)
LDL Cholesterol: 54 mg/dL (ref 0–99)
NonHDL: 71.01
Total CHOL/HDL Ratio: 3
Triglycerides: 84 mg/dL (ref 0.0–149.0)
VLDL: 16.8 mg/dL (ref 0.0–40.0)

## 2021-08-12 LAB — COMPREHENSIVE METABOLIC PANEL
ALT: 32 U/L (ref 0–53)
AST: 24 U/L (ref 0–37)
Albumin: 4.6 g/dL (ref 3.5–5.2)
Alkaline Phosphatase: 44 U/L (ref 39–117)
BUN: 17 mg/dL (ref 6–23)
CO2: 26 mEq/L (ref 19–32)
Calcium: 9.2 mg/dL (ref 8.4–10.5)
Chloride: 103 mEq/L (ref 96–112)
Creatinine, Ser: 0.99 mg/dL (ref 0.40–1.50)
GFR: 78 mL/min (ref >60.00)
Glucose, Bld: 147 mg/dL — ABNORMAL HIGH (ref 70–99)
Potassium: 4.1 mEq/L (ref 3.5–5.1)
Sodium: 139 mEq/L (ref 135–145)
Total Bilirubin: 1.1 mg/dL (ref 0.2–1.2)
Total Protein: 7.6 g/dL (ref 6.0–8.3)

## 2021-08-16 ENCOUNTER — Ambulatory Visit (INDEPENDENT_AMBULATORY_CARE_PROVIDER_SITE_OTHER): Payer: Medicare Other | Admitting: Family Medicine

## 2021-08-16 ENCOUNTER — Encounter: Payer: Self-pay | Admitting: Family Medicine

## 2021-08-16 VITALS — BP 130/70 | HR 83 | Temp 98.0°F | Ht 74.0 in | Wt 213.8 lb

## 2021-08-16 DIAGNOSIS — Z125 Encounter for screening for malignant neoplasm of prostate: Secondary | ICD-10-CM

## 2021-08-16 DIAGNOSIS — E119 Type 2 diabetes mellitus without complications: Secondary | ICD-10-CM | POA: Diagnosis not present

## 2021-08-16 DIAGNOSIS — E785 Hyperlipidemia, unspecified: Secondary | ICD-10-CM

## 2021-08-16 LAB — POCT GLYCOSYLATED HEMOGLOBIN (HGB A1C): Hemoglobin A1C: 6.3 % — AB (ref 4.0–5.6)

## 2021-08-16 MED ORDER — METFORMIN HCL ER 500 MG PO TB24: 500.0000 mg | ORAL_TABLET | Freq: Every day | ORAL | 3 refills | Status: DC

## 2021-08-16 NOTE — Progress Notes (Signed)
?  Subjective:  ?  ? Patient ID: Luis Ross, male   DOB: 06-Aug-1950, 71 y.o.   MRN: [id number] ? ?HPI ? ?Here for medical follow-up.  Type 2 diabetes.  Blood sugars have been fairly well controlled on Invokana and Rybelsus.  However, he states his fasting sugars are consistently around 130-150.  He is concerned about some gluconeogenesis during the night.  Previously took immediate release metformin but had some loose stools.  He would like to reconsider low-dose of extended release to see if he can tolerate this to see if he can help improve his fasting glucose.  Recently joined the [employer].  Has not started going yet.  He plans to work with a Psychologist, educational. ? ?Recent lipids reviewed.  His LDL cholesterol is 54 on pravastatin 40 mg daily.  Previous intolerance with multiple other statins. ? ?Takes low-dose losartan 25 mg daily.  Blood pressure stable. ? ?Past Medical History:  ?Diagnosis Date  ? HYPERLIPIDEMIA 02/26/2010  ? ?Past Surgical History:  ?Procedure Laterality Date  ? GANGLION CYST EXCISION  1964  ? left wrist  ? ? reports that he quit smoking about 9 years ago. His smoking use included cigarettes. He has a 5.00 pack-year smoking history. He has never used smokeless tobacco. He reports that he does not drink alcohol and does not use drugs. ?family history includes Alcohol abuse in his father; Heart disease in his mother; Hypertension in his mother. ?Allergies  ?Allergen Reactions  ? Crestor [Rosuvastatin Calcium]   ?  myalgias  ? Metformin And Related Diarrhea  ? ? ? ?Review of Systems  ?Constitutional:  Negative for fatigue.  ?Eyes:  Negative for visual disturbance.  ?Respiratory:  Negative for cough, chest tightness and shortness of breath.   ?Cardiovascular:  Negative for chest pain, palpitations and leg swelling.  ?Endocrine: Negative for polydipsia and polyuria.  ?Neurological:  Negative for dizziness, syncope, weakness, light-headedness and headaches.  ? ?   ?Objective:  ? Physical Exam ?Vitals reviewed.   ?Constitutional:   ?   Appearance: Normal appearance.  ?Cardiovascular:  ?   Rate and Rhythm: Normal rate and regular rhythm.  ?Pulmonary:  ?   Effort: Pulmonary effort is normal.  ?   Breath sounds: Normal breath sounds. No wheezing or rales.  ?Skin: ?   Comments: Feet reveal no skin lesions. Good distal foot pulses. Good capillary refill. No calluses. Normal sensation with monofilament testing ?  ?Neurological:  ?   Mental Status: He is alert.  ? ? ?   ?Assessment:  ?   ?#1 type 2 diabetes controlled with A1c 6.3%.  He does have elevated fasting sugars around 150 frequently.  See below ? ?#2 dyslipidemia.  Recent lipids reviewed.  LDL cholesterol 54.  His HDL has increased slightly. ?   ?Plan:  ?   ?-He is encouraged to get engage in more consistent exercise and he has plans as above to do so ? ?-Start metformin extended release 500 mg 1 daily ? ?-Set up 91-month follow-up and reassess A1c at that time along with lipids per his request ? ?-Continue yearly diabetic eye exam ? ?Kristian Covey MD ?[facility] ? ?   ?

## 2021-08-16 NOTE — Addendum Note (Signed)
Addended by: Marian Sorrow D on: 08/16/2021 09:15 AM ? ? Modules accepted: Orders ? ?

## 2021-08-24 ENCOUNTER — Other Ambulatory Visit: Payer: Self-pay

## 2021-08-24 MED ORDER — CANAGLIFLOZIN 300 MG PO TABS: 300.0000 mg | ORAL_TABLET | Freq: Every day | ORAL | 3 refills | Status: DC

## 2021-08-24 MED ORDER — RYBELSUS 14 MG PO TABS: 1.0000 | ORAL_TABLET | Freq: Every day | ORAL | 3 refills | Status: DC

## 2021-08-24 NOTE — Addendum Note (Signed)
Addended by: Christy Sartorius on: 08/24/2021 04:16 PM ? ? Modules accepted: Orders ? ?

## 2021-11-08 ENCOUNTER — Other Ambulatory Visit (INDEPENDENT_AMBULATORY_CARE_PROVIDER_SITE_OTHER): Payer: Medicare Other

## 2021-11-08 DIAGNOSIS — E119 Type 2 diabetes mellitus without complications: Secondary | ICD-10-CM

## 2021-11-08 DIAGNOSIS — Z125 Encounter for screening for malignant neoplasm of prostate: Secondary | ICD-10-CM

## 2021-11-08 DIAGNOSIS — E785 Hyperlipidemia, unspecified: Secondary | ICD-10-CM | POA: Diagnosis not present

## 2021-11-08 LAB — COMPREHENSIVE METABOLIC PANEL
ALT: 27 U/L (ref 0–53)
AST: 18 U/L (ref 0–37)
Albumin: 4.8 g/dL (ref 3.5–5.2)
Alkaline Phosphatase: 43 U/L (ref 39–117)
BUN: 16 mg/dL (ref 6–23)
CO2: 24 mEq/L (ref 19–32)
Calcium: 9.4 mg/dL (ref 8.4–10.5)
Chloride: 102 mEq/L (ref 96–112)
Creatinine, Ser: 0.96 mg/dL (ref 0.40–1.50)
GFR: 80.8 mL/min (ref >60.00)
Glucose, Bld: 168 mg/dL — ABNORMAL HIGH (ref 70–99)
Potassium: 4.2 mEq/L (ref 3.5–5.1)
Sodium: 139 mEq/L (ref 135–145)
Total Bilirubin: 0.8 mg/dL (ref 0.2–1.2)
Total Protein: 7.2 g/dL (ref 6.0–8.3)

## 2021-11-08 LAB — LIPID PANEL
Cholesterol: 123 mg/dL (ref 0–200)
HDL: 41.6 mg/dL (ref >39.00)
LDL Cholesterol: 59 mg/dL (ref 0–99)
NonHDL: 81.78
Total CHOL/HDL Ratio: 3
Triglycerides: 113 mg/dL (ref 0.0–149.0)
VLDL: 22.6 mg/dL (ref 0.0–40.0)

## 2021-11-08 LAB — HEMOGLOBIN A1C: Hgb A1c MFr Bld: 6.8 % — ABNORMAL HIGH (ref 4.6–6.5)

## 2021-11-08 LAB — PSA, MEDICARE: PSA: 0.3 ng/ml (ref 0.10–4.00)

## 2021-11-15 ENCOUNTER — Encounter: Payer: Self-pay | Admitting: Family Medicine

## 2021-11-15 ENCOUNTER — Ambulatory Visit (INDEPENDENT_AMBULATORY_CARE_PROVIDER_SITE_OTHER): Payer: Medicare Other | Admitting: Family Medicine

## 2021-11-15 VITALS — BP 118/70 | HR 90 | Temp 98.2°F | Ht 74.0 in | Wt 208.4 lb

## 2021-11-15 DIAGNOSIS — I10 Essential (primary) hypertension: Secondary | ICD-10-CM | POA: Diagnosis not present

## 2021-11-15 DIAGNOSIS — E785 Hyperlipidemia, unspecified: Secondary | ICD-10-CM | POA: Diagnosis not present

## 2021-11-15 DIAGNOSIS — E1165 Type 2 diabetes mellitus with hyperglycemia: Secondary | ICD-10-CM | POA: Diagnosis not present

## 2021-11-15 MED ORDER — LOSARTAN POTASSIUM 25 MG PO TABS: 25.0000 mg | ORAL_TABLET | Freq: Every day | ORAL | 3 refills | Status: DC

## 2021-11-15 MED ORDER — METFORMIN HCL ER 500 MG PO TB24
ORAL_TABLET | ORAL | 3 refills | Status: DC
Start: 2021-11-15 — End: 2022-02-17

## 2021-11-15 NOTE — Progress Notes (Signed)
Established Patient Office Visit  Subjective   Patient ID: Luis Ross, male    DOB: [birth year]  Age: 71 y.o. MRN: [id number]  Chief Complaint  Patient presents with   Follow-up    HPI   Here for medical follow-up.  Generally doing well.  He had family stressor of his mother-in-law coming to live with them from [place].  She was being cared for by her daughter who has history of alcoholism.  [name] plans to start back more consistent exercise at the [facility] soon.  We had recently added metformin.  He had previous issues with intolerance with immediate release.  Tolerating metformin extended release 500 mg daily with recent A1c 6.8%.  He would like to consider further titration of metformin.  He also remains on Invokana 300 mg daily, losartan 25 mg daily, pravastatin 40 mg daily, and Rybelsus 14 mg daily.  He had recent lipid panel which was stable with cholesterol 123, triglycerides 113, HDL 41, LDL 59.  Recent PSA 0.26.  Past Medical History:  Diagnosis Date   HYPERLIPIDEMIA 02/26/2010   Past Surgical History:  Procedure Laterality Date   GANGLION CYST EXCISION  1964   left wrist    reports that he quit smoking about 9 years ago. His smoking use included cigarettes. He has a 5.00 pack-year smoking history. He has never used smokeless tobacco. He reports that he does not drink alcohol and does not use drugs. family history includes Alcohol abuse in his father; Heart disease in his mother; Hypertension in his mother. Allergies  Allergen Reactions   Crestor [Rosuvastatin Calcium]     myalgias   Metformin And Related Diarrhea   ' Review of Systems  Constitutional:  Negative for malaise/fatigue.  Eyes:  Negative for blurred vision.  Respiratory:  Negative for shortness of breath.   Cardiovascular:  Negative for chest pain.  Neurological:  Negative for dizziness, weakness and headaches.      Objective:     BP 118/70 (BP Location: Left Arm, Patient Position: Sitting, Cuff  Size: Normal)   Pulse 90   Temp 98.2 F (36.8 C) (Oral)   Ht 6\' 2"  (1.88 m)   Wt 208 lb 6.4 oz (94.5 kg)   SpO2 98%   BMI 26.76 kg/m  BP Readings from Last 3 Encounters:  11/15/21 118/70  08/16/21 130/70  05/11/21 130/60   Wt Readings from Last 3 Encounters:  11/15/21 208 lb 6.4 oz (94.5 kg)  08/16/21 213 lb 12.8 oz (97 kg)  05/11/21 216 lb 12.8 oz (98.3 kg)      Physical Exam Vitals reviewed.  Constitutional:      Appearance: Normal appearance.  Cardiovascular:     Rate and Rhythm: Normal rate and regular rhythm.     Heart sounds: No murmur heard. Pulmonary:     Effort: Pulmonary effort is normal.     Breath sounds: Normal breath sounds. No wheezing or rales.  Musculoskeletal:     Right lower leg: No edema.     Left lower leg: No edema.  Neurological:     Mental Status: He is alert.      No results found for any visits on 11/15/21.  Last metabolic panel Lab Results  Component Value Date   GLUCOSE 168 (H) 11/08/2021   NA 139 11/08/2021   K 4.2 11/08/2021   CL 102 11/08/2021   CO2 24 11/08/2021   BUN 16 11/08/2021   CREATININE 0.96 11/08/2021   GFRNONAA 88 12/17/2019   CALCIUM  9.4 11/08/2021   PROT 7.2 11/08/2021   ALBUMIN 4.8 11/08/2021   BILITOT 0.8 11/08/2021   ALKPHOS 43 11/08/2021   AST 18 11/08/2021   ALT 27 11/08/2021   Last lipids Lab Results  Component Value Date   CHOL 123 11/08/2021   HDL 41.60 11/08/2021   LDLCALC 59 11/08/2021   LDLDIRECT 122.0 06/03/2019   TRIG 113.0 11/08/2021   CHOLHDL 3 11/08/2021   Last hemoglobin A1c Lab Results  Component Value Date   HGBA1C 6.8 (H) 11/08/2021      The ASCVD Risk score (Arnett DK, et al., 2019) failed to calculate for the following reasons:   The valid total cholesterol range is 130 to 320 mg/dL    Assessment & Plan:   Problem List Items Addressed This Visit       Unprioritized   Type 2 diabetes mellitus with hyperglycemia (HCC)   Relevant Medications   losartan (COZAAR) 25  MG tablet   metFORMIN (GLUCOPHAGE-XR) 500 MG 24 hr tablet   Other Relevant Orders   Hemoglobin A1c   Essential hypertension - Primary   Relevant Medications   losartan (COZAAR) 25 MG tablet   Other Relevant Orders   CMP   Hyperlipidemia   Relevant Medications   losartan (COZAAR) 25 MG tablet   Other Relevant Orders   CMP   Lipid Panel  -We discussed titrating his metformin extended release 500mg  to 1 tablet twice daily.  Recheck A1c in 3 months  -Future lab order placed for lipid and CMP per his request for 3 months from now along with A1c.  -Get back to establishing more consistent exercise  No follow-ups on file.    , MD

## 2022-01-06 ENCOUNTER — Encounter: Payer: Self-pay | Admitting: Family Medicine

## 2022-01-07 ENCOUNTER — Other Ambulatory Visit: Payer: Self-pay

## 2022-01-07 MED ORDER — DOXYCYCLINE HYCLATE 100 MG PO CAPS
ORAL_CAPSULE | ORAL | 3 refills | Status: DC
Start: 2022-01-07 — End: 2024-03-19

## 2022-02-16 ENCOUNTER — Other Ambulatory Visit (INDEPENDENT_AMBULATORY_CARE_PROVIDER_SITE_OTHER): Payer: Medicare Other

## 2022-02-16 ENCOUNTER — Encounter: Payer: Self-pay | Admitting: Family Medicine

## 2022-02-16 DIAGNOSIS — E785 Hyperlipidemia, unspecified: Secondary | ICD-10-CM | POA: Diagnosis not present

## 2022-02-16 DIAGNOSIS — I10 Essential (primary) hypertension: Secondary | ICD-10-CM

## 2022-02-16 LAB — COMPREHENSIVE METABOLIC PANEL
ALT: 25 U/L (ref 0–53)
AST: 22 U/L (ref 0–37)
Albumin: 4.5 g/dL (ref 3.5–5.2)
Alkaline Phosphatase: 39 U/L (ref 39–117)
BUN: 18 mg/dL (ref 6–23)
CO2: 28 mEq/L (ref 19–32)
Calcium: 9.5 mg/dL (ref 8.4–10.5)
Chloride: 102 mEq/L (ref 96–112)
Creatinine, Ser: 0.9 mg/dL (ref 0.40–1.50)
GFR: 87.14 mL/min (ref >60.00)
Glucose, Bld: 140 mg/dL — ABNORMAL HIGH (ref 70–99)
Potassium: 4.3 mEq/L (ref 3.5–5.1)
Sodium: 138 mEq/L (ref 135–145)
Total Bilirubin: 1.2 mg/dL (ref 0.2–1.2)
Total Protein: 7.4 g/dL (ref 6.0–8.3)

## 2022-02-16 LAB — LIPID PANEL
Cholesterol: 129 mg/dL (ref 0–200)
HDL: 46.7 mg/dL (ref >39.00)
LDL Cholesterol: 59 mg/dL (ref 0–99)
NonHDL: 82.31
Total CHOL/HDL Ratio: 3
Triglycerides: 118 mg/dL (ref 0.0–149.0)
VLDL: 23.6 mg/dL (ref 0.0–40.0)

## 2022-02-17 MED ORDER — METFORMIN HCL ER 500 MG PO TB24: ORAL_TABLET | ORAL | 3 refills | Status: DC

## 2022-02-17 NOTE — Telephone Encounter (Signed)
I sent in new dose of Metformin extended release 500 mg take two twice daily.   Eulas Post MD [facility]

## 2022-02-23 ENCOUNTER — Ambulatory Visit (INDEPENDENT_AMBULATORY_CARE_PROVIDER_SITE_OTHER): Payer: Medicare Other | Admitting: Family Medicine

## 2022-02-23 ENCOUNTER — Encounter: Payer: Self-pay | Admitting: Family Medicine

## 2022-02-23 VITALS — BP 130/72 | HR 100 | Temp 98.2°F | Wt 210.2 lb

## 2022-02-23 DIAGNOSIS — E1165 Type 2 diabetes mellitus with hyperglycemia: Secondary | ICD-10-CM | POA: Diagnosis not present

## 2022-02-23 DIAGNOSIS — E785 Hyperlipidemia, unspecified: Secondary | ICD-10-CM | POA: Diagnosis not present

## 2022-02-23 DIAGNOSIS — I10 Essential (primary) hypertension: Secondary | ICD-10-CM | POA: Diagnosis not present

## 2022-02-23 DIAGNOSIS — Z23 Encounter for immunization: Secondary | ICD-10-CM

## 2022-02-23 LAB — POCT GLYCOSYLATED HEMOGLOBIN (HGB A1C): Hemoglobin A1C: 6.7 % — AB (ref 4.0–5.6)

## 2022-02-23 NOTE — Progress Notes (Signed)
Established Patient Office Visit  Subjective   Patient ID: Luis Ross, male    DOB: [birth year]  Age: 71 y.o. MRN: [id number]  Chief Complaint  Patient presents with   Follow-up    HPI   [name] is seen for medical follow up.   Generally doing well.  He has history of type 2 diabetes, hyperlipidemia, rosacea, mild hypertension.  He recently had called with fasting glucose readings around 150-160 and we had increased his metformin extended release to 500 mg 2 twice daily.  He is tolerating well with no side effects.  His eye exam is up-to-date.  Does need urine microalbumin with next labs.  He also inquires about Prevnar 20 today.  He had previous Prevnar 13 and Pneumovax but his Pneumovax that appears was before age 28.  He is otherwise up-to-date with vaccines.  Has had flu vaccine and also RSV vaccine.  [facility] and has been [facility] more regularly.  His diabetes medications include metformin extended release, Invokana, and Rybelsus.  He remains on pravastatin for hyperlipidemia with no significant myalgias.  Takes losartan 25 mg daily.  His mother-in-law came to live with them several months ago.  She is on hospice with end-stage COPD but relatively stable at this time.  Past Medical History:  Diagnosis Date   HYPERLIPIDEMIA 02/26/2010   Past Surgical History:  Procedure Laterality Date   GANGLION CYST EXCISION  1964   left wrist    reports that he quit smoking about 10 years ago. His smoking use included cigarettes. He has a 5.00 pack-year smoking history. He has never used smokeless tobacco. He reports that he does not drink alcohol and does not use drugs. family history includes Alcohol abuse in his father; Heart disease in his mother; Hypertension in his mother. Allergies  Allergen Reactions   Crestor [Rosuvastatin Calcium]     myalgias   Metformin And Related Diarrhea    Review of Systems  Constitutional:  Negative for malaise/fatigue.  Eyes:  Negative  for blurred vision.  Respiratory:  Negative for shortness of breath.   Cardiovascular:  Negative for chest pain.  Neurological:  Negative for dizziness, weakness and headaches.      Objective:     BP 130/72 (BP Location: Left Arm, Patient Position: Sitting, Cuff Size: Normal)   Pulse 100   Temp 98.2 F (36.8 C) (Oral)   Wt 210 lb 3.2 oz (95.3 kg)   SpO2 97%   BMI 26.99 kg/m  BP Readings from Last 3 Encounters:  02/23/22 130/72  11/15/21 118/70  08/16/21 130/70   Wt Readings from Last 3 Encounters:  02/23/22 210 lb 3.2 oz (95.3 kg)  11/15/21 208 lb 6.4 oz (94.5 kg)  08/16/21 213 lb 12.8 oz (97 kg)      Physical Exam Constitutional:      Appearance: He is well-developed.  HENT:     Right Ear: External ear normal.     Left Ear: External ear normal.  Eyes:     Pupils: Pupils are equal, round, and reactive to light.  Neck:     Thyroid: No thyromegaly.  Cardiovascular:     Rate and Rhythm: Normal rate and regular rhythm.  Pulmonary:     Effort: Pulmonary effort is normal. No respiratory distress.     Breath sounds: Normal breath sounds. No wheezing or rales.  Musculoskeletal:     Cervical back: Neck supple.     Right lower leg: No edema.  Left lower leg: No edema.  Skin:    Comments: Feet reveal no skin lesions. Good distal foot pulses. Good capillary refill. No calluses. Normal sensation with monofilament testing   Neurological:     Mental Status: He is alert and oriented to person, place, and time.      Results for orders placed or performed in visit on 02/23/22  POCT glycosylated hemoglobin (Hb A1C)  Result Value Ref Range   Hemoglobin A1C 6.7 (A) 4.0 - 5.6 %   HbA1c POC (<> result, manual entry)     HbA1c, POC (prediabetic range)     HbA1c, POC (controlled diabetic range)      Last CBC Lab Results  Component Value Date   WBC 5.9 08/12/2021   HGB 16.6 08/12/2021   HCT 48.8 08/12/2021   MCV 93.4 08/12/2021   RDW 12.9 08/12/2021   PLT 192.0  08/12/2021   Last metabolic panel Lab Results  Component Value Date   GLUCOSE 140 (H) 02/16/2022   NA 138 02/16/2022   K 4.3 02/16/2022   CL 102 02/16/2022   CO2 28 02/16/2022   BUN 18 02/16/2022   CREATININE 0.90 02/16/2022   GFRNONAA 88 12/17/2019   CALCIUM 9.5 02/16/2022   PROT 7.4 02/16/2022   ALBUMIN 4.5 02/16/2022   BILITOT 1.2 02/16/2022   ALKPHOS 39 02/16/2022   AST 22 02/16/2022   ALT 25 02/16/2022   Last lipids Lab Results  Component Value Date   CHOL 129 02/16/2022   HDL 46.70 02/16/2022   LDLCALC 59 02/16/2022   LDLDIRECT 122.0 06/03/2019   TRIG 118.0 02/16/2022   CHOLHDL 3 02/16/2022   Last hemoglobin A1c Lab Results  Component Value Date   HGBA1C 6.7 (A) 02/23/2022      The ASCVD Risk score (Arnett DK, et al., 2019) failed to calculate for the following reasons:   The valid total cholesterol range is 130 to 320 mg/dL    Assessment & Plan:   #1 type 2 diabetes controlled with A1c 6.7%.  We recently bumped up his metformin and this should hopefully improve.  He is also exercising more regularly.  Set up 60-month follow-up.  Check urine microalbumin with next labs  #2 hyperlipidemia.  Patient on pravastatin 40 mg daily.  Future lab order entered for lipid at next follow-up fasting  #3 hypertension stable on low-dose losartan  #4 health maintenance.  Prevnar 20 given.  Other vaccines up-to-date.   Return in about 3 months (around 05/26/2022).    Evelena Peat, MD

## 2022-04-08 ENCOUNTER — Ambulatory Visit (INDEPENDENT_AMBULATORY_CARE_PROVIDER_SITE_OTHER): Payer: Medicare Other

## 2022-04-08 VITALS — Ht 74.0 in | Wt 207.0 lb

## 2022-04-08 DIAGNOSIS — Z0000 Encounter for general adult medical examination without abnormal findings: Secondary | ICD-10-CM | POA: Diagnosis not present

## 2022-04-08 NOTE — Progress Notes (Signed)
Subjective:   Luis Ross is a 71 y.o. male who presents for Medicare Annual/Subsequent preventive examination.  Review of Systems    Virtual Visit via Telephone Note  I connected with  [name] on 04/08/22 at  9:00 AM EST by telephone and verified that I am speaking with the correct person using two identifiers.  Location: Patient: Home Provider: Office Persons participating in the virtual visit: patient/Nurse Health Advisor   I discussed the limitations, risks, security and privacy concerns of performing an evaluation and management service by telephone and the availability of in person appointments. The patient expressed understanding and agreed to proceed.  Interactive audio and video telecommunications were attempted between this nurse and patient, however failed, due to patient having technical difficulties OR patient did not have access to video capability.  We continued and completed visit with audio only.  Some vital signs may be absent or patient reported.   [name], LPN  Cardiac Risk Factors include: advanced age (>45mn, >>90women);diabetes mellitus;hypertension;male gender     Objective:    Today's Vitals   04/08/22 0906  Weight: 207 lb (93.9 kg)  Height: _0  (1.88 m)   Body mass index is 26.58 kg/m.     04/08/2022    9:12 AM 03/24/2021    8:25 AM 03/23/2020    9:19 AM 10/10/2019   11:18 AM  Advanced Directives  Does Patient Have a Medical Advance Directive? Yes Yes No;Yes Yes  Type of AParamedicof [place]obbtownLiving will HSanta RitaLiving will HCajah's MountainLiving will [place] Does patient want to make changes to medical advance directive?  No - Patient declined No - Patient declined   Copy of H[place] Chart? No - copy requested No - copy requested No - copy requested No - copy requested    Current Medications (verified) Outpatient  Encounter Medications as of 04/08/2022  Medication Sig   Blood Glucose Monitoring Suppl ([place] w/Device KIT Use to test blood glucose 2 times daily.   canagliflozin (INVOKANA) 300 MG TABS tablet Take 1 tablet (300 mg total) by mouth daily.   cyclobenzaprine (FLEXERIL) 10 MG tablet Take 1 tablet (10 mg total) by mouth 3 (three) times daily as needed for muscle spasms.   doxycycline (VIBRAMYCIN) 100 MG capsule Take one capsule (100 mg total) by mouth 2 (two) times daily.   glucose blood (ONETOUCH VERIO) test strip USE AS DIRECTED TO TEST BLOOD SUGAR TWO TIMES A DAY   losartan (COZAAR) 25 MG tablet Take 1 tablet (25 mg total) by mouth daily.   metFORMIN (GLUCOPHAGE-XR) 500 MG 24 hr tablet Take two tablets  by mouth twice daily   OneTouch Delica Lancets 355MMISC Use to test blood glucose 2 times daily.   pravastatin (PRAVACHOL) 40 MG tablet Take 1 tablet (40 mg total) by mouth daily.   Semaglutide (RYBELSUS) 14 MG TABS Take 1 tablet (14 mg total) by mouth daily.   No facility-administered encounter medications on file as of 04/08/2022.    Allergies (verified) Crestor [rosuvastatin calcium] and Metformin and related   History: Past Medical History:  Diagnosis Date   HYPERLIPIDEMIA 02/26/2010   Past Surgical History:  Procedure Laterality Date   GANGLION CYST EXCISION  1964   left wrist   Family History  Problem Relation Age of Onset   Hypertension Mother    Heart disease Mother    Alcohol abuse  Father    Social History   Socioeconomic History   Marital status: Married    Spouse name: Not on file   Number of children: Not on file   Years of education: Not on file   Highest education level: Professional school degree (e.g., MD, DDS, DVM, JD)  Occupational History   Not on file  Tobacco Use   Smoking status: Former    Packs/day: 0.50    Years: 10.00    Total pack years: 5.00    Types: Cigarettes    Quit date: 11/27/2011    Years since quitting: 10.3    Smokeless tobacco: Never  Vaping Use   Vaping Use: Never used  Substance and Sexual Activity   Alcohol use: No    Alcohol/week: 0.0 standard drinks of alcohol   Drug use: No   Sexual activity: Not on file  Other Topics Concern   Not on file  Social History Narrative   Not on file   Social Determinants of Health   Financial Resource Strain: Low Risk  (04/08/2022)   Overall Financial Resource Strain (CARDIA)    Difficulty of Paying Living Expenses: Not hard at all  Food Insecurity: No Food Insecurity (04/08/2022)   Hunger Vital Sign    Worried About Running Out of Food in the Last Year: Never true    [place] in the Last Year: Never true  Transportation Needs: No Transportation Needs (04/08/2022)   PRAPARE - Hydrologist (Medical): No    Lack of Transportation (Non-Medical): No  Physical Activity: Sufficiently Active (04/08/2022)   Exercise Vital Sign    Days of Exercise per Week: 4 days    Minutes of Exercise per Session: 80 min  Stress: No Stress Concern Present (04/08/2022)   [place]    Feeling of Stress : Not at all  Social Connections: [place] (04/08/2022)   Social Connection and Isolation Panel [NHANES]    Frequency of Communication with Friends and Family: More than three times a week    Frequency of Social Gatherings with Friends and Family: More than three times a week    Attends Religious Services: More than 4 times per year    Active Member of [employer] or Organizations: Yes    Attends Music therapist: More than 4 times per year    Marital Status: Married    Tobacco Counseling Counseling given: Not Answered   Clinical Intake:  Pre-visit preparation completed: Yes  Pain : No/denies pain   Nutrition Risk Assessment:  Has the patient had any N/V/D within the last 2 months?  No  Does the patient have any non-healing wounds?  No   Has the patient had any unintentional weight loss or weight gain?  No   Diabetes:  Is the patient diabetic?  Yes  If diabetic, was a CBG obtained today?  No  Did the patient bring in their glucometer from home?  No  How often do you monitor your CBG's? 2 x Weekly.   Financial Strains and Diabetes Management:  Are you having any financial strains with the device, your supplies or your medication? No .  Does the patient want to be seen by Chronic Care Management for management of their diabetes?  No  Would the patient like to be referred to a Nutritionist or for Diabetic Management?  No   Diabetic Exams:  Diabetic Eye Exam: Completed No. Overdue for diabetic  eye exam. Pt has been advised about the importance in completing this exam. A referral has been placed today. Message sent to referral coordinator for scheduling purposes. Advised pt to expect a call from office referred to regarding appt.  Diabetic Foot Exam: Completed No. Pt has been advised about the importance in completing this exam. Pt is scheduled for diabetic foot exam on Followed by PCP.    BMI - recorded: 26.58 Nutritional Status: BMI 25 -29 Overweight Nutritional Risks: None Diabetes: Yes CBG done?: Yes CBG resulted in Enter/ Edit results?: No Did pt. bring in CBG monitor from home?: No  How often do you need to have someone help you when you read instructions, pamphlets, or other written materials from your doctor or pharmacy?: 1 - Never  Diabetic? Yes  Interpreter Needed?: No  Information entered by :: Rolene Arbour LPN   Activities of Daily Living    04/08/2022    9:11 AM 04/04/2022    9:38 AM  In your present state of health, do you have any difficulty performing the following activities:  Hearing? 0 0  Vision? 0 0  Difficulty concentrating or making decisions? 0 0  Walking or climbing stairs? 0 0  Dressing or bathing? 0 0  Doing errands, shopping? 0 0  Preparing Food and eating ? N N  Using the  Toilet? N N  In the past six months, have you accidently leaked urine? N N  Do you have problems with loss of bowel control? N N  Managing your Medications? N N  Managing your Finances? N N  Housekeeping or managing your Housekeeping? N N    Patient Care Team: Eulas Post, MD as PCP - General  Indicate any recent Medical Services you may have received from other than [facility] providers in the past year (date may be approximate).     Assessment:   This is a routine wellness examination for [place].  Hearing/Vision screen Hearing Screening - Comments:: Denies hearing difficulties   Vision Screening - Comments:: Wears rx glasses - up to date with routine eye exams with  Dr Celene Squibb  Dietary issues and exercise activities discussed: Current Exercise Habits: Home exercise routine, Time (Minutes): > 60, Frequency (Times/Week): 4, Weekly Exercise (Minutes/Week): 0, Intensity: Moderate, Exercise limited by: None identified   Goals Addressed   None    Depression Screen    04/08/2022    9:11 AM 03/24/2021    8:18 AM 03/23/2020    9:21 AM 03/23/2020    8:33 AM 09/09/2019    8:57 AM 09/29/2017    2:03 PM  PHQ 2/9 Scores  PHQ - 2 Score 0 0 0 0 0 0  PHQ- 9 Score   0       Fall Risk    04/08/2022    9:12 AM 04/04/2022    9:38 AM 05/07/2021   12:56 PM 03/24/2021    8:21 AM 03/20/2021    9:27 AM  Fall Risk   Falls in the past year? 0 0 0 0 0  Number falls in past yr:  0  0 0  Injury with Fall?  0  0 0    FALL RISK PREVENTION PERTAINING TO THE HOME:  Any stairs in or around the home? Yes  If so, are there any without handrails? No  Home free of loose throw rugs in walkways, pet beds, electrical cords, etc? Yes  Adequate lighting in your home to reduce risk of falls? Yes   ASSISTIVE DEVICES  UTILIZED TO PREVENT FALLS:  Life alert? No  Use of a cane, walker or w/c? No  Grab bars in the bathroom? Yes  Shower chair or bench in shower? No  Elevated toilet seat or a  handicapped toilet? Yes   TIMED UP AND GO:  Was the test performed? No . Audio Visit  Cognitive Function:        04/08/2022    9:12 AM 03/24/2021    8:23 AM  6CIT Screen  What Year? 0 points 0 points  What month? 0 points 0 points  What time? 0 points 0 points  Count back from 20 0 points 0 points  Months in reverse 0 points 0 points  Repeat phrase 0 points 0 points  Total Score 0 points 0 points    Immunizations Immunization History  Administered Date(s) Administered   Fluad Quad(high Dose 65+) 01/29/2021, 01/25/2022   Influenza, High Dose Seasonal PF 01/30/2019   Influenza, Seasonal, Injecte, Preservative Fre 02/11/2014, 02/05/2018   Influenza-Unspecified 01/24/2015, 01/24/2016, 02/01/2017   PFIZER(Purple Top)SARS-COV-2 Vaccination 05/23/2019, 06/03/2019, 06/20/2019, 01/19/2020, 07/25/2020, 01/08/2021   PNEUMOCOCCAL CONJUGATE-20 02/23/2022   Pfizer Covid-19 Vaccine Bivalent Booster 44yr & up 01/09/2022   Pneumococcal Conjugate-13 01/24/2015, 02/05/2018   Pneumococcal Polysaccharide-23 01/28/2015   Respiratory Syncytial Virus Vaccine,Recomb Aduvanted(Arexvy) 01/25/2022   Td 04/26/2003   Tdap 04/09/2013, 01/29/2021   Zoster Recombinat (Shingrix) 06/30/2020, 11/10/2020    TDAP status: Up to date  Flu Vaccine status: Up to date  Pneumococcal vaccine status: Up to date  Covid-19 vaccine status: Completed vaccines  Qualifies for Shingles Vaccine? Yes   Zostavax completed Yes   Shingrix Completed?: Yes  Screening Tests Health Maintenance  Topic Date Due   Diabetic kidney evaluation - Urine ACR  04/22/2017   COVID-19 Vaccine (8 - 2023-24 season) 04/24/2022 (Originally 03/06/2022)   OPHTHALMOLOGY EXAM  07/06/2022   HEMOGLOBIN A1C  08/24/2022   Diabetic kidney evaluation - eGFR measurement  02/17/2023   FOOT EXAM  02/24/2023   Medicare Annual Wellness (AWV)  04/09/2023   COLONOSCOPY (Pts 45-468yrInsurance coverage will need to be confirmed)  01/06/2026    DTaP/Tdap/Td (4 - Td or Tdap) 01/30/2031   Pneumonia Vaccine 6549Years old  Completed   Hepatitis C Screening  Completed   Zoster Vaccines- Shingrix  Completed   HPV VACCINES  Aged Out   INFLUENZA VACCINE  Discontinued    Health Maintenance  Health Maintenance Due  Topic Date Due   Diabetic kidney evaluation - Urine ACR  04/22/2017    Colorectal cancer screening: Type of screening: Colonoscopy. Completed 01/06/21. Repeat every 5 years  Lung Cancer Screening: (Low Dose CT Chest recommended if Age 69-80ears, 30 pack-year currently smoking OR have quit w/in 15years.) does not qualify.     Additional Screening:  Hepatitis C Screening: does qualify; Completed 09/22/17  Vision Screening: Recommended annual ophthalmology exams for early detection of glaucoma and other disorders of the eye. Is the patient up to date with their annual eye exam?  Yes  Who is the provider or what is the name of the office in which the patient attends annual eye exams? Dr McCelene Squibbf pt is not established with a provider, would they like to be referred to a provider to establish care? No .   Dental Screening: Recommended annual dental exams for proper oral hygiene  Community Resource Referral / Chronic Care Management:  CRR required this visit?  No   CCM required this visit?  No  Plan:     I have personally reviewed and noted the following in the patient's chart:   Medical and social history Use of alcohol, tobacco or illicit drugs  Current medications and supplements including opioid prescriptions. Patient is not currently taking opioid prescriptions. Functional ability and status Nutritional status Physical activity Advanced directives List of other physicians Hospitalizations, surgeries, and ER visits in previous 12 months Vitals Screenings to include cognitive, depression, and falls Referrals and appointments  In addition, I have reviewed and discussed with patient certain  preventive protocols, quality metrics, and best practice recommendations. A written personalized care plan for preventive services as well as general preventive health recommendations were provided to patient.     [name], LPN   [id number]   Nurse Notes: None

## 2022-04-08 NOTE — Patient Instructions (Addendum)
Luis Ross , Thank you for taking time to come for your Medicare Wellness Visit. I appreciate your ongoing commitment to your health goals. Please review the following plan we discussed and let me know if I can assist you in the future.   These are the goals we discussed:  Goals      Exercise 150 min/wk Moderate Activity        This is a list of the screening recommended for you and due dates:  Health Maintenance  Topic Date Due   Yearly kidney health urinalysis for diabetes  04/22/2017   COVID-19 Vaccine (8 - 2023-24 season) 04/24/2022*   Eye exam for diabetics  07/06/2022   Hemoglobin A1C  08/24/2022   Yearly kidney function blood test for diabetes  02/17/2023   Complete foot exam   02/24/2023   Medicare Annual Wellness Visit  04/09/2023   Colon Cancer Screening  01/06/2026   DTaP/Tdap/Td vaccine (4 - Td or Tdap) 01/30/2031   Pneumonia Vaccine  Completed   Hepatitis C Screening: USPSTF Recommendation to screen - Ages 34-79 yo.  Completed   Zoster (Shingles) Vaccine  Completed   HPV Vaccine  Aged Out   Flu Shot  Discontinued  *Topic was postponed. The date shown is not the original due date.    Advanced directives: Please bring a copy of your health care power of attorney and living will to the office to be added to your chart at your convenience.   Conditions/risks identified: None  Next appointment: Follow up in one year for your annual wellness visit.    Preventive Care 55 Years and Older, Male  Preventive care refers to lifestyle choices and visits with your health care provider that can promote health and wellness. What does preventive care include? A yearly physical exam. This is also called an annual well check. Dental exams once or twice a year. Routine eye exams. Ask your health care provider how often you should have your eyes checked. Personal lifestyle choices, including: Daily care of your teeth and gums. Regular physical activity. Eating a healthy  diet. Avoiding tobacco and drug use. Limiting alcohol use. Practicing safe sex. Taking low doses of aspirin every day. Taking vitamin and mineral supplements as recommended by your health care provider. What happens during an annual well check? The services and screenings done by your health care provider during your annual well check will depend on your age, overall health, lifestyle risk factors, and family history of disease. Counseling  Your health care provider may ask you questions about your: Alcohol use. Tobacco use. Drug use. Emotional well-being. Home and relationship well-being. Sexual activity. Eating habits. History of falls. Memory and ability to understand (cognition). Work and work Astronomer. Screening  You may have the following tests or measurements: Height, weight, and BMI. Blood pressure. Lipid and cholesterol levels. These may be checked every 5 years, or more frequently if you are over 60 years old. Skin check. Lung cancer screening. You may have this screening every year starting at age 26 if you have a 30-pack-year history of smoking and currently smoke or have quit within the past 15 years. Fecal occult blood test (FOBT) of the stool. You may have this test every year starting at age 11. Flexible sigmoidoscopy or colonoscopy. You may have a sigmoidoscopy every 5 years or a colonoscopy every 10 years starting at age 58. Prostate cancer screening. Recommendations will vary depending on your family history and other risks. Hepatitis C blood test. Hepatitis B  blood test. Sexually transmitted disease (STD) testing. Diabetes screening. This is done by checking your blood sugar (glucose) after you have not eaten for a while (fasting). You may have this done every 1-3 years. Abdominal aortic aneurysm (AAA) screening. You may need this if you are a current or former smoker. Osteoporosis. You may be screened starting at age 46 if you are at high risk. Talk with  your health care provider about your test results, treatment options, and if necessary, the need for more tests. Vaccines  Your health care provider may recommend certain vaccines, such as: Influenza vaccine. This is recommended every year. Tetanus, diphtheria, and acellular pertussis (Tdap, Td) vaccine. You may need a Td booster every 10 years. Zoster vaccine. You may need this after age 5. Pneumococcal 13-valent conjugate (PCV13) vaccine. One dose is recommended after age 9. Pneumococcal polysaccharide (PPSV23) vaccine. One dose is recommended after age 63. Talk to your health care provider about which screenings and vaccines you need and how often you need them. This information is not intended to replace advice given to you by your health care provider. Make sure you discuss any questions you have with your health care provider. Document Released: 05/08/2015 Document Revised: 12/30/2015 Document Reviewed: 02/10/2015 Elsevier Interactive Patient Education  2017 [employer] [employer] Prevention in the Home Falls can cause injuries. They can happen to people of all ages. There are many things you can do to make your home safe and to help prevent falls. What can I do on the outside of my home? Regularly fix the edges of walkways and driveways and fix any cracks. Remove anything that might make you trip as you walk through a door, such as a raised step or threshold. Trim any bushes or trees on the path to your home. Use bright outdoor lighting. Clear any walking paths of anything that might make someone trip, such as rocks or tools. Regularly check to see if handrails are loose or broken. Make sure that both sides of any steps have handrails. Any raised decks and porches should have guardrails on the edges. Have any leaves, snow, or ice cleared regularly. Use sand or salt on walking paths during winter. Clean up any spills in your garage right away. This includes oil or grease spills. What  can I do in the bathroom? Use night lights. Install grab bars by the toilet and in the tub and shower. Do not use towel bars as grab bars. Use non-skid mats or decals in the tub or shower. If you need to sit down in the shower, use a plastic, non-slip stool. Keep the floor dry. Clean up any water that spills on the floor as soon as it happens. Remove soap buildup in the tub or shower regularly. Attach bath mats securely with double-sided non-slip rug tape. Do not have throw rugs and other things on the floor that can make you trip. What can I do in the bedroom? Use night lights. Make sure that you have a light by your bed that is easy to reach. Do not use any sheets or blankets that are too big for your bed. They should not hang down onto the floor. Have a firm chair that has side arms. You can use this for support while you get dressed. Do not have throw rugs and other things on the floor that can make you trip. What can I do in the kitchen? Clean up any spills right away. Avoid walking on wet floors. Keep items  that you use a lot in easy-to-reach places. If you need to reach something above you, use a strong step stool that has a grab bar. Keep electrical cords out of the way. Do not use floor polish or wax that makes floors slippery. If you must use wax, use non-skid floor wax. Do not have throw rugs and other things on the floor that can make you trip. What can I do with my stairs? Do not leave any items on the stairs. Make sure that there are handrails on both sides of the stairs and use them. Fix handrails that are broken or loose. Make sure that handrails are as long as the stairways. Check any carpeting to make sure that it is firmly attached to the stairs. Fix any carpet that is loose or worn. Avoid having throw rugs at the top or bottom of the stairs. If you do have throw rugs, attach them to the floor with carpet tape. Make sure that you have a light switch at the top of the  stairs and the bottom of the stairs. If you do not have them, ask someone to add them for you. What else can I do to help prevent falls? Wear shoes that: Do not have high heels. Have rubber bottoms. Are comfortable and fit you well. Are closed at the toe. Do not wear sandals. If you use a stepladder: Make sure that it is fully opened. Do not climb a closed stepladder. Make sure that both sides of the stepladder are locked into place. Ask someone to hold it for you, if possible. Clearly mark and make sure that you can see: Any grab bars or handrails. First and last steps. Where the edge of each step is. Use tools that help you move around (mobility aids) if they are needed. These include: Canes. Walkers. Scooters. Crutches. Turn on the lights when you go into a dark area. Replace any light bulbs as soon as they burn out. Set up your furniture so you have a clear path. Avoid moving your furniture around. If any of your floors are uneven, fix them. If there are any pets around you, be aware of where they are. Review your medicines with your doctor. Some medicines can make you feel dizzy. This can increase your chance of falling. Ask your doctor what other things that you can do to help prevent falls. This information is not intended to replace advice given to you by your health care provider. Make sure you discuss any questions you have with your health care provider. Document Released: 02/05/2009 Document Revised: 09/17/2015 Document Reviewed: 05/16/2014 Elsevier Interactive Patient Education  2017 [employer].

## 2022-04-20 ENCOUNTER — Encounter: Payer: Self-pay | Admitting: Family Medicine

## 2022-04-20 MED ORDER — CYCLOBENZAPRINE HCL 10 MG PO TABS: 10.0000 mg | ORAL_TABLET | Freq: Three times a day (TID) | ORAL | 1 refills | Status: AC | PRN

## 2022-06-01 ENCOUNTER — Other Ambulatory Visit: Payer: Self-pay

## 2022-06-01 MED ORDER — CANAGLIFLOZIN 300 MG PO TABS: 300.0000 mg | ORAL_TABLET | Freq: Every day | ORAL | 2 refills | Status: DC

## 2022-06-01 MED ORDER — RYBELSUS 14 MG PO TABS: 1.0000 | ORAL_TABLET | Freq: Every day | ORAL | 2 refills | Status: DC

## 2022-06-02 ENCOUNTER — Other Ambulatory Visit (INDEPENDENT_AMBULATORY_CARE_PROVIDER_SITE_OTHER): Payer: Medicare Other

## 2022-06-02 ENCOUNTER — Other Ambulatory Visit: Payer: Medicare Other

## 2022-06-02 DIAGNOSIS — E785 Hyperlipidemia, unspecified: Secondary | ICD-10-CM

## 2022-06-02 DIAGNOSIS — E1165 Type 2 diabetes mellitus with hyperglycemia: Secondary | ICD-10-CM

## 2022-06-02 LAB — MICROALBUMIN / CREATININE URINE RATIO
Creatinine,U: 64.2 mg/dL
Microalb Creat Ratio: 1.1 mg/g (ref 0.0–30.0)
Microalb, Ur: <0.7 mg/dL (ref 0.0–1.9)

## 2022-06-02 LAB — LIPID PANEL
Cholesterol: 123 mg/dL (ref 0–200)
HDL: 42 mg/dL (ref >39.00)
LDL Cholesterol: 65 mg/dL (ref 0–99)
NonHDL: 81.16
Total CHOL/HDL Ratio: 3
Triglycerides: 80 mg/dL (ref 0.0–149.0)
VLDL: 16 mg/dL (ref 0.0–40.0)

## 2022-06-02 LAB — COMPREHENSIVE METABOLIC PANEL
ALT: 35 U/L (ref 0–53)
AST: 24 U/L (ref 0–37)
Albumin: 4.5 g/dL (ref 3.5–5.2)
Alkaline Phosphatase: 41 U/L (ref 39–117)
BUN: 20 mg/dL (ref 6–23)
CO2: 25 mEq/L (ref 19–32)
Calcium: 9.3 mg/dL (ref 8.4–10.5)
Chloride: 104 mEq/L (ref 96–112)
Creatinine, Ser: 0.82 mg/dL (ref 0.40–1.50)
GFR: 89.44 mL/min (ref >60.00)
Glucose, Bld: 138 mg/dL — ABNORMAL HIGH (ref 70–99)
Potassium: 4.4 mEq/L (ref 3.5–5.1)
Sodium: 139 mEq/L (ref 135–145)
Total Bilirubin: 1.3 mg/dL — ABNORMAL HIGH (ref 0.2–1.2)
Total Protein: 7.4 g/dL (ref 6.0–8.3)

## 2022-06-06 ENCOUNTER — Ambulatory Visit (INDEPENDENT_AMBULATORY_CARE_PROVIDER_SITE_OTHER): Payer: Medicare Other | Admitting: Family Medicine

## 2022-06-06 ENCOUNTER — Encounter: Payer: Self-pay | Admitting: Family Medicine

## 2022-06-06 VITALS — BP 120/60 | HR 93 | Temp 99.1°F | Wt 207.9 lb

## 2022-06-06 DIAGNOSIS — R5383 Other fatigue: Secondary | ICD-10-CM | POA: Diagnosis not present

## 2022-06-06 DIAGNOSIS — E1165 Type 2 diabetes mellitus with hyperglycemia: Secondary | ICD-10-CM

## 2022-06-06 LAB — POCT GLYCOSYLATED HEMOGLOBIN (HGB A1C): Hemoglobin A1C: 6.2 % — AB (ref 4.0–5.6)

## 2022-06-06 NOTE — Progress Notes (Signed)
Established Patient Office Visit  Subjective   Patient ID: Luis Ross, male    DOB: 29-Nov-1950  Age: 72 y.o. MRN: [id number]  Chief Complaint  Patient presents with   Medical Management of Chronic Issues    HPI   Here for medical follow-up.  Just returned recently from trip to [place].  Had a very nice vacation there.  He and his wife had been taking care of his mother-in-law for several years at their home and she recently died.  She had complications including dementia.  Her quality of life was very poor at the and and this was actually a relief for them.  He ran out of metformin couple weeks ago.  He tried starting back at full dosage at 1 point but had fairly severe diarrhea and GI symptoms.  He plans to slowly phase this back in.  He also remains on Rybelsus 14 mg daily and Invokana 300 mg daily.  Takes pravastatin for hyperlipidemia.  Previous intolerance with other statins.  Recent labs reviewed.  Lipids stable.  A1c today is actually improved to 6.2%.  Other chemistries were stable.  Past Medical History:  Diagnosis Date   HYPERLIPIDEMIA 02/26/2010   Past Surgical History:  Procedure Laterality Date   GANGLION CYST EXCISION  1964   left wrist    reports that he quit smoking about 10 years ago. His smoking use included cigarettes. He has a 5.00 pack-year smoking history. He has never used smokeless tobacco. He reports that he does not drink alcohol and does not use drugs. family history includes Alcohol abuse in his father; Heart disease in his mother; Hypertension in his mother. Allergies  Allergen Reactions   Crestor [Rosuvastatin Calcium]     myalgias   Metformin And Related Diarrhea      Review of Systems  Constitutional:  Negative for malaise/fatigue.  Eyes:  Negative for blurred vision.  Respiratory:  Negative for shortness of breath.   Cardiovascular:  Negative for chest pain.  Neurological:  Negative for dizziness, weakness and headaches.       Objective:     BP 120/60 (BP Location: Left Arm, Patient Position: Sitting, Cuff Size: Normal)   Pulse 93   Temp 99.1 F (37.3 C) (Oral)   Wt 207 lb 14.4 oz (94.3 kg)   SpO2 96%   BMI 26.69 kg/m  BP Readings from Last 3 Encounters:  06/06/22 120/60  02/23/22 130/72  11/15/21 118/70   Wt Readings from Last 3 Encounters:  06/06/22 207 lb 14.4 oz (94.3 kg)  04/08/22 207 lb (93.9 kg)  02/23/22 210 lb 3.2 oz (95.3 kg)      Physical Exam Vitals reviewed.  Constitutional:      Appearance: He is well-developed.  Neck:     Thyroid: No thyromegaly.  Cardiovascular:     Rate and Rhythm: Normal rate and regular rhythm.  Pulmonary:     Effort: Pulmonary effort is normal. No respiratory distress.     Breath sounds: Normal breath sounds. No wheezing or rales.  Musculoskeletal:     Cervical back: Neck supple.  Neurological:     Mental Status: He is alert and oriented to person, place, and time.      Results for orders placed or performed in visit on 06/06/22  POC HgB A1c  Result Value Ref Range   Hemoglobin A1C 6.2 (A) 4.0 - 5.6 %   HbA1c POC (<> result, manual entry)     HbA1c, POC (prediabetic range)  HbA1c, POC (controlled diabetic range)        The ASCVD Risk score (Arnett DK, et al., 2019) failed to calculate for the following reasons:   The valid total cholesterol range is 130 to 320 mg/dL    Assessment & Plan:   Problem List Items Addressed This Visit       Unprioritized   Type 2 diabetes mellitus ([facility]) - Primary   Relevant Orders   POC HgB A1c (Completed)   Lipid panel   CMP   Other Visit Diagnoses     Fatigue, unspecified type       Relevant Orders   CBC with Differential/Platelet     Controlled type 2 diabetes with A1c today 6.2%.  Continue current medications.  He will phase back in metformin slowly to help reduce risk of side effects. -Continue annual diabetic eye exam -Set up 33-monthfollow-up.  He is requesting fasting lipids and CMP at  follow-up.  He has had some fatigue issues and will also check CBC  Return in about 3 months (around 09/04/2022).    BCarolann Littler MD

## 2022-07-15 LAB — HM DIABETES EYE EXAM

## 2022-07-18 ENCOUNTER — Other Ambulatory Visit: Payer: Self-pay | Admitting: Family Medicine

## 2022-08-30 ENCOUNTER — Other Ambulatory Visit: Payer: Medicare Other

## 2022-08-31 ENCOUNTER — Other Ambulatory Visit: Payer: Medicare Other

## 2022-09-05 ENCOUNTER — Ambulatory Visit: Payer: Medicare Other | Admitting: Family Medicine

## 2022-09-29 ENCOUNTER — Other Ambulatory Visit: Payer: Medicare Other

## 2022-09-30 ENCOUNTER — Other Ambulatory Visit (INDEPENDENT_AMBULATORY_CARE_PROVIDER_SITE_OTHER): Payer: Medicare Other

## 2022-09-30 DIAGNOSIS — E1165 Type 2 diabetes mellitus with hyperglycemia: Secondary | ICD-10-CM | POA: Diagnosis not present

## 2022-09-30 DIAGNOSIS — R5383 Other fatigue: Secondary | ICD-10-CM | POA: Diagnosis not present

## 2022-09-30 LAB — CBC WITH DIFFERENTIAL/PLATELET
Basophils Absolute: 0.1 10*3/uL (ref 0.0–0.1)
Basophils Relative: 0.9 % (ref 0.0–3.0)
Eosinophils Absolute: 0.1 10*3/uL (ref 0.0–0.7)
Eosinophils Relative: 2 % (ref 0.0–5.0)
HCT: 48.3 % (ref 39.0–52.0)
Hemoglobin: 16.3 g/dL (ref 13.0–17.0)
Lymphocytes Relative: 28.6 % (ref 12.0–46.0)
Lymphs Abs: 1.6 10*3/uL (ref 0.7–4.0)
MCHC: 33.7 g/dL (ref 30.0–36.0)
MCV: 93.9 fl (ref 78.0–100.0)
Monocytes Absolute: 0.6 10*3/uL (ref 0.1–1.0)
Monocytes Relative: 9.9 % (ref 3.0–12.0)
Neutro Abs: 3.3 10*3/uL (ref 1.4–7.7)
Neutrophils Relative %: 58.6 % (ref 43.0–77.0)
Platelets: 211 10*3/uL (ref 150.0–400.0)
RBC: 5.14 Mil/uL (ref 4.22–5.81)
RDW: 13.1 % (ref 11.5–15.5)
WBC: 5.7 10*3/uL (ref 4.0–10.5)

## 2022-09-30 LAB — COMPREHENSIVE METABOLIC PANEL
ALT: 34 U/L (ref 0–53)
AST: 23 U/L (ref 0–37)
Albumin: 4.4 g/dL (ref 3.5–5.2)
Alkaline Phosphatase: 40 U/L (ref 39–117)
BUN: 14 mg/dL (ref 6–23)
CO2: 28 mEq/L (ref 19–32)
Calcium: 9.2 mg/dL (ref 8.4–10.5)
Chloride: 102 mEq/L (ref 96–112)
Creatinine, Ser: 0.87 mg/dL (ref 0.40–1.50)
GFR: 87.65 mL/min (ref >60.00)
Glucose, Bld: 153 mg/dL — ABNORMAL HIGH (ref 70–99)
Potassium: 4.5 mEq/L (ref 3.5–5.1)
Sodium: 139 mEq/L (ref 135–145)
Total Bilirubin: 0.8 mg/dL (ref 0.2–1.2)
Total Protein: 7.4 g/dL (ref 6.0–8.3)

## 2022-09-30 LAB — LIPID PANEL
Cholesterol: 125 mg/dL (ref 0–200)
HDL: 44 mg/dL (ref >39.00)
LDL Cholesterol: 59 mg/dL (ref 0–99)
NonHDL: 81.16
Total CHOL/HDL Ratio: 3
Triglycerides: 111 mg/dL (ref 0.0–149.0)
VLDL: 22.2 mg/dL (ref 0.0–40.0)

## 2022-10-04 ENCOUNTER — Encounter: Payer: Self-pay | Admitting: Family Medicine

## 2022-10-04 ENCOUNTER — Ambulatory Visit (INDEPENDENT_AMBULATORY_CARE_PROVIDER_SITE_OTHER): Payer: Medicare Other | Admitting: Family Medicine

## 2022-10-04 VITALS — BP 128/62 | HR 80 | Ht 74.0 in | Wt 210.5 lb

## 2022-10-04 DIAGNOSIS — E1165 Type 2 diabetes mellitus with hyperglycemia: Secondary | ICD-10-CM | POA: Diagnosis not present

## 2022-10-04 DIAGNOSIS — Z7984 Long term (current) use of oral hypoglycemic drugs: Secondary | ICD-10-CM | POA: Diagnosis not present

## 2022-10-04 LAB — POCT GLYCOSYLATED HEMOGLOBIN (HGB A1C): Hemoglobin A1C: 6.7 % — AB (ref 4.0–5.6)

## 2022-10-04 NOTE — Progress Notes (Signed)
Established Patient Office Visit  Subjective   Patient ID: Luis Ross, male    DOB: 1950/06/22  Age: 72 y.o. MRN: [id number]  Chief Complaint  Patient presents with   Medical Management of Chronic Issues    HPI   Luis Ross is seen for follow-up chronic medical problems.  He has type 2 diabetes which has been controlled.  He does have occasional diarrhea with morning use of metformin and frequently has to split his dose with 1 at breakfast 1 around noon and then 2 at night.  Also taking Invokana and Rybelsus.  Going to the [facility] few days per week.  Last A1c 6.2%.  Has never really had any significant hyperlipidemia but does have family history of CAD in his mother and he has been on statin with pravastatin for several years.  Lipids have been well-controlled.  Has also remained on low-dose losartan for renal protection.  Blood pressures have been stable.  No recent chest pains or any other complaints.  His daughter and son in law recently adopted their first grandchild He is doing well.  Past Medical History:  Diagnosis Date   HYPERLIPIDEMIA 02/26/2010   Past Surgical History:  Procedure Laterality Date   GANGLION CYST EXCISION  1964   left wrist    reports that he quit smoking about 10 years ago. His smoking use included cigarettes. He has a 5.00 pack-year smoking history. He has never used smokeless tobacco. He reports that he does not drink alcohol and does not use drugs. family history includes Alcohol abuse in his father; Heart disease in his mother; Hypertension in his mother. Allergies  Allergen Reactions   Crestor [Rosuvastatin Calcium]     myalgias   Metformin And Related Diarrhea    Review of Systems  Constitutional:  Negative for malaise/fatigue.  Eyes:  Negative for blurred vision.  Respiratory:  Negative for shortness of breath.   Cardiovascular:  Negative for chest pain.  Neurological:  Negative for dizziness, weakness and headaches.      Objective:      BP 128/62 (BP Location: Left Arm, Patient Position: Sitting, Cuff Size: Normal)   Pulse 80   Ht 6\' 2"  (1.88 m)   Wt 210 lb 8 oz (95.5 kg)   SpO2 98%   BMI 27.03 kg/m  BP Readings from Last 3 Encounters:  10/04/22 128/62  06/06/22 120/60  02/23/22 130/72   Wt Readings from Last 3 Encounters:  10/04/22 210 lb 8 oz (95.5 kg)  06/06/22 207 lb 14.4 oz (94.3 kg)  04/08/22 207 lb (93.9 kg)      Physical Exam Vitals reviewed.  Constitutional:      Appearance: Normal appearance.  Cardiovascular:     Rate and Rhythm: Normal rate and regular rhythm.  Pulmonary:     Effort: Pulmonary effort is normal.     Breath sounds: Normal breath sounds.  Neurological:     Mental Status: He is alert.      Results for orders placed or performed in visit on 10/04/22  POC HgB A1c  Result Value Ref Range   Hemoglobin A1C 6.7 (A) 4.0 - 5.6 %   HbA1c POC (<> result, manual entry)     HbA1c, POC (prediabetic range)     HbA1c, POC (controlled diabetic range)      Last CBC Lab Results  Component Value Date   WBC 5.7 09/30/2022   HGB 16.3 09/30/2022   HCT 48.3 09/30/2022   MCV 93.9 09/30/2022   RDW  13.1 09/30/2022   PLT 211.0 09/30/2022   Last metabolic panel Lab Results  Component Value Date   GLUCOSE 153 (H) 09/30/2022   NA 139 09/30/2022   K 4.5 09/30/2022   CL 102 09/30/2022   CO2 28 09/30/2022   BUN 14 09/30/2022   CREATININE 0.87 09/30/2022   GFRNONAA 88 12/17/2019   CALCIUM 9.2 09/30/2022   PROT 7.4 09/30/2022   ALBUMIN 4.4 09/30/2022   BILITOT 0.8 09/30/2022   ALKPHOS 40 09/30/2022   AST 23 09/30/2022   ALT 34 09/30/2022   Last lipids Lab Results  Component Value Date   CHOL 125 09/30/2022   HDL 44.00 09/30/2022   LDLCALC 59 09/30/2022   LDLDIRECT 122.0 06/03/2019   TRIG 111.0 09/30/2022   CHOLHDL 3 09/30/2022   Last hemoglobin A1c Lab Results  Component Value Date   HGBA1C 6.7 (A) 10/04/2022      The ASCVD Risk score (Arnett DK, et al., 2019) failed  to calculate for the following reasons:   The valid total cholesterol range is 130 to 320 mg/dL    Assessment & Plan:   Problem List Items Addressed This Visit       Unprioritized   Type 2 diabetes mellitus ([facility]) - Primary   Relevant Orders   POC HgB A1c (Completed)   Lipid panel   CMP   Hemoglobin A1c  Type 2 diabetes remains well-controlled.  A1c up slightly to 6.7%.  Continue current regimen.  Continue regular exercise habits.  Set up future labs with lipid, CMP, and A1c in 3 months  No follow-ups on file.    Evelena Peat, MD

## 2022-10-08 ENCOUNTER — Other Ambulatory Visit: Payer: Self-pay | Admitting: Family Medicine

## 2022-12-15 ENCOUNTER — Other Ambulatory Visit: Payer: Self-pay | Admitting: Family Medicine

## 2022-12-15 MED ORDER — PRAVASTATIN SODIUM 40 MG PO TABS: 40.0000 mg | ORAL_TABLET | Freq: Every day | ORAL | 0 refills | Status: DC

## 2023-01-27 ENCOUNTER — Other Ambulatory Visit: Payer: Medicare Other

## 2023-01-31 ENCOUNTER — Other Ambulatory Visit: Payer: Medicare Other

## 2023-02-01 ENCOUNTER — Other Ambulatory Visit (INDEPENDENT_AMBULATORY_CARE_PROVIDER_SITE_OTHER): Payer: Medicare Other

## 2023-02-01 DIAGNOSIS — E1165 Type 2 diabetes mellitus with hyperglycemia: Secondary | ICD-10-CM

## 2023-02-01 LAB — COMPREHENSIVE METABOLIC PANEL
ALT: 34 U/L (ref 0–53)
AST: 26 U/L (ref 0–37)
Albumin: 4.7 g/dL (ref 3.5–5.2)
Alkaline Phosphatase: 50 U/L (ref 39–117)
BUN: 16 mg/dL (ref 6–23)
CO2: 28 meq/L (ref 19–32)
Calcium: 10 mg/dL (ref 8.4–10.5)
Chloride: 101 meq/L (ref 96–112)
Creatinine, Ser: 0.85 mg/dL (ref 0.40–1.50)
GFR: 88.06 mL/min (ref >60.00)
Glucose, Bld: 128 mg/dL — ABNORMAL HIGH (ref 70–99)
Potassium: 4.2 meq/L (ref 3.5–5.1)
Sodium: 139 meq/L (ref 135–145)
Total Bilirubin: 1.6 mg/dL — ABNORMAL HIGH (ref 0.2–1.2)
Total Protein: 7.4 g/dL (ref 6.0–8.3)

## 2023-02-01 LAB — LIPID PANEL
Cholesterol: 113 mg/dL (ref 0–200)
HDL: 47.6 mg/dL (ref >39.00)
LDL Cholesterol: 51 mg/dL (ref 0–99)
NonHDL: 65.59
Total CHOL/HDL Ratio: 2
Triglycerides: 73 mg/dL (ref 0.0–149.0)
VLDL: 14.6 mg/dL (ref 0.0–40.0)

## 2023-02-03 ENCOUNTER — Ambulatory Visit: Payer: Medicare Other | Admitting: Family Medicine

## 2023-02-08 ENCOUNTER — Ambulatory Visit: Payer: Medicare Other | Admitting: Family Medicine

## 2023-02-08 ENCOUNTER — Other Ambulatory Visit: Payer: Medicare Other

## 2023-02-09 ENCOUNTER — Other Ambulatory Visit: Payer: Medicare Other

## 2023-02-10 ENCOUNTER — Ambulatory Visit (INDEPENDENT_AMBULATORY_CARE_PROVIDER_SITE_OTHER): Payer: Medicare Other | Admitting: Family Medicine

## 2023-02-10 VITALS — BP 120/76 | HR 82 | Ht 74.0 in | Wt 207.7 lb

## 2023-02-10 DIAGNOSIS — Z7984 Long term (current) use of oral hypoglycemic drugs: Secondary | ICD-10-CM | POA: Diagnosis not present

## 2023-02-10 DIAGNOSIS — Z23 Encounter for immunization: Secondary | ICD-10-CM | POA: Diagnosis not present

## 2023-02-10 DIAGNOSIS — E1165 Type 2 diabetes mellitus with hyperglycemia: Secondary | ICD-10-CM

## 2023-02-10 DIAGNOSIS — Z125 Encounter for screening for malignant neoplasm of prostate: Secondary | ICD-10-CM

## 2023-02-10 LAB — POCT GLYCOSYLATED HEMOGLOBIN (HGB A1C): Hemoglobin A1C: 6.5 % — AB (ref 4.0–5.6)

## 2023-02-10 NOTE — Progress Notes (Signed)
Established Patient Office Visit  Subjective   Patient ID: Luis Ross, male    DOB: 1950/06/09  Age: 72 y.o. MRN: [id number]  No chief complaint on file.   HPI   Luis Ross is seen for medical follow-up.  He has history of type 2 diabetes and hyperlipidemia.  He and his wife just got back from 1 month trip to [place].  He had COVID-vaccine on 01-17-2023.  Needs flu vaccine today.  Blood sugars have been stable.  Unable to tolerate metformin twice daily usually secondary to diarrhea.  Does take metformin once daily and also Invokana and Rybelsus.  He had recent lipid panel with LDL cholesterol 51.  Remains on pravastatin.  Previous intolerance to other statins.  Has lost couple pounds since his return from [place].  Past Medical History:  Diagnosis Date   HYPERLIPIDEMIA 02/26/2010   Past Surgical History:  Procedure Laterality Date   GANGLION CYST EXCISION  1964   left wrist    reports that he quit smoking about 11 years ago. His smoking use included cigarettes. He started smoking about 21 years ago. He has a 5 pack-year smoking history. He has never used smokeless tobacco. He reports that he does not drink alcohol and does not use drugs. family history includes Alcohol abuse in his father; Heart disease in his mother; Hypertension in his mother. Allergies  Allergen Reactions   Crestor [Rosuvastatin Calcium]     myalgias   Metformin And Related Diarrhea    Review of Systems  Constitutional:  Negative for malaise/fatigue.  Eyes:  Negative for blurred vision.  Respiratory:  Negative for shortness of breath.   Cardiovascular:  Negative for chest pain.  Neurological:  Negative for dizziness, weakness and headaches.      Objective:     BP 120/76 (BP Location: Left Arm, Patient Position: Sitting, Cuff Size: Normal)   Pulse 82   Ht 6\' 2"  (1.88 m)   Wt 207 lb 11.2 oz (94.2 kg)   SpO2 100%   BMI 26.67 kg/m  BP Readings from Last 3 Encounters:  02/10/23 120/76  10/04/22  128/62  06/06/22 120/60   Wt Readings from Last 3 Encounters:  02/10/23 207 lb 11.2 oz (94.2 kg)  10/04/22 210 lb 8 oz (95.5 kg)  06/06/22 207 lb 14.4 oz (94.3 kg)      Physical Exam Vitals reviewed.  Constitutional:      Appearance: He is well-developed.  Neck:     Thyroid: No thyromegaly.  Cardiovascular:     Rate and Rhythm: Normal rate and regular rhythm.  Pulmonary:     Effort: Pulmonary effort is normal. No respiratory distress.     Breath sounds: Normal breath sounds. No wheezing or rales.  Neurological:     General: No focal deficit present.     Mental Status: He is alert and oriented to person, place, and time.      Results for orders placed or performed in visit on 02/10/23  POC HgB A1c  Result Value Ref Range   Hemoglobin A1C 6.5 (A) 4.0 - 5.6 %   HbA1c POC (<> result, manual entry)     HbA1c, POC (prediabetic range)     HbA1c, POC (controlled diabetic range)      Last CBC Lab Results  Component Value Date   WBC 5.7 09/30/2022   HGB 16.3 09/30/2022   HCT 48.3 09/30/2022   MCV 93.9 09/30/2022   RDW 13.1 09/30/2022   PLT 211.0 09/30/2022   Last  metabolic panel Lab Results  Component Value Date   GLUCOSE 128 (H) 02/01/2023   NA 139 02/01/2023   K 4.2 02/01/2023   CL 101 02/01/2023   CO2 28 02/01/2023   BUN 16 02/01/2023   CREATININE 0.85 02/01/2023   GFR 88.06 02/01/2023   CALCIUM 10.0 02/01/2023   PROT 7.4 02/01/2023   ALBUMIN 4.7 02/01/2023   BILITOT 1.6 (H) 02/01/2023   ALKPHOS 50 02/01/2023   AST 26 02/01/2023   ALT 34 02/01/2023   Last lipids Lab Results  Component Value Date   CHOL 113 02/01/2023   HDL 47.60 02/01/2023   LDLCALC 51 02/01/2023   LDLDIRECT 122.0 06/03/2019   TRIG 73.0 02/01/2023   CHOLHDL 2 02/01/2023   Last hemoglobin A1c Lab Results  Component Value Date   HGBA1C 6.5 (A) 02/10/2023      The ASCVD Risk score (Arnett DK, et al., 2019) failed to calculate for the following reasons:   The valid total  cholesterol range is 130 to 320 mg/dL    Assessment & Plan:   #1 type 2 diabetes controlled with A1c today 6.5%.  Continue medication regimen as above.  Set up 86-month follow-up.  Continue yearly diabetic eye exam.  Check urine microalbumin screen at follow-up  #2 hyperlipidemia controlled with recent LDL 51 on pravastatin.  Continue low saturated fat diet  #3 health maintenance-flu vaccine given.  We discussed future order for PSA at next lab   Return in about 3 months (around 05/13/2023).    Evelena Peat, MD

## 2023-02-21 ENCOUNTER — Other Ambulatory Visit: Payer: Self-pay | Admitting: Family Medicine

## 2023-03-28 ENCOUNTER — Other Ambulatory Visit: Payer: Self-pay | Admitting: Family Medicine

## 2023-04-12 ENCOUNTER — Ambulatory Visit: Payer: Medicare Other

## 2023-04-12 VITALS — Ht 74.0 in | Wt 204.0 lb

## 2023-04-12 DIAGNOSIS — Z0000 Encounter for general adult medical examination without abnormal findings: Secondary | ICD-10-CM | POA: Diagnosis not present

## 2023-04-12 NOTE — Progress Notes (Signed)
Subjective:   Luis Ross is a 72 y.o. male who presents for Medicare Annual/Subsequent preventive examination.  Visit Complete: Virtual I connected with  Luis Ross on 04/12/23 by a audio enabled telemedicine application and verified that I am speaking with the correct person using two identifiers.  Patient Location: Home  Provider Location: Home Office  I discussed the limitations of evaluation and management by telemedicine. The patient expressed understanding and agreed to proceed.  Vital Signs: Because this visit was a virtual/telehealth visit, some criteria may be missing or patient reported. Any vitals not documented were not able to be obtained and vitals that have been documented are patient reported.  Patient Medicare AWV questionnaire was completed by the patient on 04/08/23; I have confirmed that all information answered by patient is correct and no changes since this date.  Cardiac Risk Factors include: advanced age (>38men, >65 women);diabetes mellitus;male gender     Objective:    Today's Vitals   04/12/23 1309  Weight: 204 lb (92.5 kg)  Height: 6\' 2"  (1.88 m)   Body mass index is 26.19 kg/m.     04/12/2023    1:54 PM 04/08/2022    9:12 AM 03/24/2021    8:25 AM 03/23/2020    9:19 AM 10/10/2019   11:18 AM  Advanced Directives  Does Patient Have a Medical Advance Directive? Yes Yes Yes No;Yes Yes  Type of Estate agent of [place];Living will Healthcare Power of [place];Living will Healthcare Power of [place];Living will Healthcare Power of [place];Living will Healthcare Power of Attorney  Does patient want to make changes to medical advance directive?   No - Patient declined No - Patient declined   Copy of Healthcare Power of Attorney in Chart? No - copy requested No - copy requested No - copy requested No - copy requested No - copy requested    Current Medications (verified) Outpatient Encounter Medications as of  04/12/2023  Medication Sig   Blood Glucose Monitoring Suppl (ONETOUCH VERIO FLEX SYSTEM) w/Device KIT Use to test blood glucose 2 times daily.   cyclobenzaprine (FLEXERIL) 10 MG tablet Take 1 tablet (10 mg total) by mouth 3 (three) times daily as needed for muscle spasms.   doxycycline (VIBRAMYCIN) 100 MG capsule Take one capsule (100 mg total) by mouth 2 (two) times daily.   glucose blood (ONETOUCH VERIO) test strip USE AS DIRECTED TO TEST BLOOD SUGAR TWO TIMES A DAY   INVOKANA 300 MG TABS tablet TAKE 1 TABLET DAILY   losartan (COZAAR) 25 MG tablet Take 1 tablet (25 mg total) by mouth daily.   metFORMIN (GLUCOPHAGE-XR) 500 MG 24 hr tablet Take two tablets by mouth twice daily   OneTouch Delica Lancets 33G MISC Use to test blood glucose 2 times daily.   pravastatin (PRAVACHOL) 40 MG tablet Take 1 tablet (40 mg total) by mouth daily.   RYBELSUS 14 MG TABS TAKE 1 TABLET DAILY   No facility-administered encounter medications on file as of 04/12/2023.    Allergies (verified) Crestor [rosuvastatin calcium] and Metformin and related   History: Past Medical History:  Diagnosis Date   HYPERLIPIDEMIA 02/26/2010   Past Surgical History:  Procedure Laterality Date   GANGLION CYST EXCISION  1964   left wrist   Family History  Problem Relation Age of Onset   Hypertension Mother    Heart disease Mother    Alcohol abuse Father    Social History   Socioeconomic History   Marital status: Married  Spouse name: Not on file   Number of children: Not on file   Years of education: Not on file   Highest education level: Professional school degree (e.g., MD, DDS, DVM, JD)  Occupational History   Not on file  Tobacco Use   Smoking status: Former    Current packs/day: 0.00    Average packs/day: 0.5 packs/day for 10.0 years (5.0 ttl pk-yrs)    Types: Cigarettes    Start date: 11/26/2001    Quit date: 11/27/2011    Years since quitting: 11.3   Smokeless tobacco: Never  Vaping Use   Vaping  status: Never Used  Substance and Sexual Activity   Alcohol use: No    Alcohol/week: 0.0 standard drinks of alcohol   Drug use: No   Sexual activity: Not on file  Other Topics Concern   Not on file  Social History Narrative   Not on file   Social Drivers of Health   Financial Resource Strain: Low Risk  (04/08/2023)   Overall Financial Resource Strain (CARDIA)    Difficulty of Paying Living Expenses: Not hard at all  Food Insecurity: No Food Insecurity (04/08/2023)   Hunger Vital Sign    Worried About Running Out of Food in the Last Year: Never true    Ran Out of Food in the Last Year: Never true  Transportation Needs: No Transportation Needs (04/08/2023)   PRAPARE - Administrator, Civil Service (Medical): No    Lack of Transportation (Non-Medical): No  Physical Activity: Insufficiently Active (04/08/2023)   Exercise Vital Sign    Days of Exercise per Week: 3 days    Minutes of Exercise per Session: 20 min  Stress: No Stress Concern Present (04/08/2023)   [employer] of Occupational Health - Occupational Stress Questionnaire    Feeling of Stress : Not at all  Social Connections: Socially Integrated (04/08/2023)   Social Connection and Isolation Panel [NHANES]    Frequency of Communication with Friends and Family: Twice a week    Frequency of Social Gatherings with Friends and Family: More than three times a week    Attends Religious Services: More than 4 times per year    Active Member of [employer] or Organizations: Yes    Attends Engineer, structural: More than 4 times per year    Marital Status: Married    Tobacco Counseling Counseling given: Not Answered   Clinical Intake:  Pre-visit preparation completed: Yes  Pain : No/denies pain     BMI - recorded: 26.19 Nutritional Status: BMI 25 -29 Overweight Nutritional Risks: None Diabetes: Yes CBG done?: Yes (CBG 156 Per patient) CBG resulted in Enter/ Edit results?: Yes Did pt. bring in  CBG monitor from home?: No  How often do you need to have someone help you when you read instructions, pamphlets, or other written materials from your doctor or pharmacy?: 1 - Never  Interpreter Needed?: No  Information entered by :: Theresa Mulligan LPN   Activities of Daily Living    04/08/2023   10:42 AM  In your present state of health, do you have any difficulty performing the following activities:  Hearing? 0  Vision? 0  Difficulty concentrating or making decisions? 0  Walking or climbing stairs? 0  Dressing or bathing? 0  Doing errands, shopping? 0  Preparing Food and eating ? N  Using the Toilet? N  In the past six months, have you accidently leaked urine? N  Do you have problems with loss  of bowel control? N  Managing your Medications? N  Managing your Finances? N  Housekeeping or managing your Housekeeping? N    Patient Care Team: Kristian Covey, MD as PCP - General  Indicate any recent Medical Services you may have received from other than [facility] providers in the past year (date may be approximate).     Assessment:   This is a routine wellness examination for [place].  Hearing/Vision screen Hearing Screening - Comments:: Denies hearing difficulties   Vision Screening - Comments:: Wears rx glasses - up to date with routine eye exams with  Dr Honor Loh   Goals Addressed               This Visit's Progress     Stay Active (pt-stated)        Stay healthy!       Depression Screen    04/12/2023    1:22 PM 04/08/2022    9:11 AM 03/24/2021    8:18 AM 03/23/2020    9:21 AM 03/23/2020    8:33 AM 09/09/2019    8:57 AM 09/29/2017    2:03 PM  PHQ 2/9 Scores  PHQ - 2 Score 0 0 0 0 0 0 0  PHQ- 9 Score    0       Fall Risk    04/12/2023    1:29 PM 04/08/2023   10:42 AM 06/02/2022    3:44 PM 04/08/2022    9:12 AM 04/04/2022    9:38 AM  Fall Risk   Falls in the past year? 0 0 0 0 0  Number falls in past yr: 0 0   0  Injury with Fall? 0 0   0  Risk  for fall due to : No Fall Risks      Follow up Falls prevention discussed        MEDICARE RISK AT HOME: Medicare Risk at Home Any stairs in or around the home?: Yes If so, are there any without handrails?: No Home free of loose throw rugs in walkways, pet beds, electrical cords, etc?: Yes Adequate lighting in your home to reduce risk of falls?: Yes Life alert?: No Use of a cane, walker or w/c?: No Grab bars in the bathroom?: Yes Shower chair or bench in shower?: No Elevated toilet seat or a handicapped toilet?: Yes  TIMED UP AND GO:  Was the test performed?  No    Cognitive Function:        04/12/2023    1:55 PM 04/08/2022    9:12 AM 03/24/2021    8:23 AM  6CIT Screen  What Year? 0 points 0 points 0 points  What month? 0 points 0 points 0 points  What time? 0 points 0 points 0 points  Count back from 20 0 points 0 points 0 points  Months in reverse 0 points 0 points 0 points  Repeat phrase 0 points 0 points 0 points  Total Score 0 points 0 points 0 points    Immunizations Immunization History  Administered Date(s) Administered   Fluad Quad(high Dose 65+) 01/29/2021, 01/25/2022   Fluad Trivalent(High Dose 65+) 02/10/2023   Influenza, High Dose Seasonal PF 01/30/2019   Influenza, Seasonal, Injecte, Preservative Fre 02/11/2014, 02/05/2018   Influenza-Unspecified 01/24/2015, 01/24/2016, 02/01/2017   PFIZER(Purple Top)SARS-COV-2 Vaccination 05/23/2019, 06/03/2019, 06/20/2019, 01/19/2020, 07/25/2020, 01/08/2021   PNEUMOCOCCAL CONJUGATE-20 02/23/2022   Pfizer Covid-19 Vaccine Bivalent Booster 11yrs & up 01/09/2022   Pneumococcal Conjugate-13 01/24/2015, 02/05/2018   Pneumococcal Polysaccharide-23 01/28/2015  Respiratory Syncytial Virus Vaccine,Recomb Aduvanted(Arexvy) 01/25/2022   Td 04/26/2003   Tdap 04/09/2013, 01/29/2021   Unspecified SARS-COV-2 Vaccination 01/17/2023   Zoster Recombinant(Shingrix) 06/30/2020, 11/10/2020    TDAP status: Up to  date    Pneumococcal vaccine status: Up to date  Covid-19 vaccine status: Completed vaccines  Qualifies for Shingles Vaccine? Yes   Zostavax completed Yes   Shingrix Completed?: Yes  Screening Tests Health Maintenance  Topic Date Due   FOOT EXAM  02/24/2023   Diabetic kidney evaluation - Urine ACR  06/03/2023   OPHTHALMOLOGY EXAM  07/15/2023   HEMOGLOBIN A1C  08/11/2023   Diabetic kidney evaluation - eGFR measurement  02/01/2024   Medicare Annual Wellness (AWV)  04/11/2024   Colonoscopy  01/06/2026   DTaP/Tdap/Td (4 - Td or Tdap) 01/30/2031   Pneumonia Vaccine 46+ Years old  Completed   COVID-19 Vaccine  Completed   Hepatitis C Screening  Completed   Zoster Vaccines- Shingrix  Completed   HPV VACCINES  Aged Out   INFLUENZA VACCINE  Discontinued    Health Maintenance  Health Maintenance Due  Topic Date Due   FOOT EXAM  02/24/2023    Colorectal cancer screening: Type of screening: Colonoscopy. Completed 01/06/21. Repeat every 5 years  Additional Screening:  Hepatitis C Screening: does qualify; Completed 09/22/17  Vision Screening: Recommended annual ophthalmology exams for early detection of glaucoma and other disorders of the eye. Is the patient up to date with their annual eye exam?  Yes  Who is the provider or what is the name of the office in which the patient attends annual eye exams? Dr Honor Loh If pt is not established with a provider, would they like to be referred to a provider to establish care? No .   Dental Screening: Recommended annual dental exams for proper oral hygiene  Diabetic Foot Exam: Diabetic Foot Exam: Overdue, Pt has been advised about the importance in completing this exam. Pt is scheduled for diabetic foot exam on Deferred.  Community Resource Referral / Chronic Care Management:  CRR required this visit?  No   CCM required this visit?  No     Plan:     I have personally reviewed and noted the following in the patient's chart:    Medical and social history Use of alcohol, tobacco or illicit drugs  Current medications and supplements including opioid prescriptions. Patient is not currently taking opioid prescriptions. Functional ability and status Nutritional status Physical activity Advanced directives List of other physicians Hospitalizations, surgeries, and ER visits in previous 12 months Vitals Screenings to include cognitive, depression, and falls Referrals and appointments  In addition, I have reviewed and discussed with patient certain preventive protocols, quality metrics, and best practice recommendations. A written personalized care plan for preventive services as well as general preventive health recommendations were provided to patient.     Tillie Rung, LPN   [id number]/5784   After Visit Summary: (MyChart) Due to this being a telephonic visit, the after visit summary with patients personalized plan was offered to patient via MyChart   Nurse Notes: None

## 2023-04-12 NOTE — Patient Instructions (Addendum)
Luis Ross , Thank you for taking time to come for your Medicare Wellness Visit. I appreciate your ongoing commitment to your health goals. Please review the following plan we discussed and let me know if I can assist you in the future.   Referrals/Orders/Follow-Ups/Clinician Recommendations:   This is a list of the screening recommended for you and due dates:  Health Maintenance  Topic Date Due   Complete foot exam   02/24/2023   Yearly kidney health urinalysis for diabetes  06/03/2023   Eye exam for diabetics  07/15/2023   Hemoglobin A1C  08/11/2023   Yearly kidney function blood test for diabetes  02/01/2024   Medicare Annual Wellness Visit  04/11/2024   Colon Cancer Screening  01/06/2026   DTaP/Tdap/Td vaccine (4 - Td or Tdap) 01/30/2031   Pneumonia Vaccine  Completed   COVID-19 Vaccine  Completed   Hepatitis C Screening  Completed   Zoster (Shingles) Vaccine  Completed   HPV Vaccine  Aged Out   Flu Shot  Discontinued    Advanced directives: (Copy Requested) Please bring a copy of your health care power of attorney and living will to the office to be added to your chart at your convenience.  Next Medicare Annual Wellness Visit scheduled for next year: No

## 2023-05-10 ENCOUNTER — Other Ambulatory Visit (INDEPENDENT_AMBULATORY_CARE_PROVIDER_SITE_OTHER): Payer: Medicare Other

## 2023-05-10 ENCOUNTER — Telehealth: Payer: Self-pay

## 2023-05-10 DIAGNOSIS — E1165 Type 2 diabetes mellitus with hyperglycemia: Secondary | ICD-10-CM | POA: Diagnosis not present

## 2023-05-10 DIAGNOSIS — Z125 Encounter for screening for malignant neoplasm of prostate: Secondary | ICD-10-CM | POA: Diagnosis not present

## 2023-05-10 LAB — COMPREHENSIVE METABOLIC PANEL
ALT: 29 U/L (ref 0–53)
AST: 19 U/L (ref 0–37)
Albumin: 4.7 g/dL (ref 3.5–5.2)
Alkaline Phosphatase: 47 U/L (ref 39–117)
BUN: 17 mg/dL (ref 6–23)
CO2: 27 meq/L (ref 19–32)
Calcium: 9.5 mg/dL (ref 8.4–10.5)
Chloride: 103 meq/L (ref 96–112)
Creatinine, Ser: 0.77 mg/dL (ref 0.40–1.50)
GFR: 90.56 mL/min (ref >60.00)
Glucose, Bld: 160 mg/dL — ABNORMAL HIGH (ref 70–99)
Potassium: 4.3 meq/L (ref 3.5–5.1)
Sodium: 139 meq/L (ref 135–145)
Total Bilirubin: 1.2 mg/dL (ref 0.2–1.2)
Total Protein: 7.2 g/dL (ref 6.0–8.3)

## 2023-05-10 LAB — PSA, MEDICARE: PSA: 0.38 ng/mL (ref 0.10–4.00)

## 2023-05-10 LAB — LIPID PANEL
Cholesterol: 132 mg/dL (ref 0–200)
HDL: 41.5 mg/dL (ref >39.00)
LDL Cholesterol: 66 mg/dL (ref 0–99)
NonHDL: 90.98
Total CHOL/HDL Ratio: 3
Triglycerides: 126 mg/dL (ref 0.0–149.0)
VLDL: 25.2 mg/dL (ref 0.0–40.0)

## 2023-05-10 LAB — MICROALBUMIN / CREATININE URINE RATIO
Creatinine,U: 66.1 mg/dL
Microalb Creat Ratio: 1.1 mg/g (ref 0.0–30.0)
Microalb, Ur: <0.7 mg/dL (ref 0.0–1.9)

## 2023-05-10 NOTE — Telephone Encounter (Signed)
 Copied from CRM [id number]. Topic: Clinical - Lab/Test Results >> May 10, 2023  4:39 PM Crispin Dolphin wrote: Reason for CRM: Patient called returning call to [facility]. Read note as written by provider. Patient was also able to view results on MyChart.

## 2023-05-11 ENCOUNTER — Other Ambulatory Visit: Payer: Medicare Other

## 2023-05-12 NOTE — Telephone Encounter (Signed)
Noted  

## 2023-05-17 ENCOUNTER — Encounter: Payer: Self-pay | Admitting: Family Medicine

## 2023-05-17 ENCOUNTER — Ambulatory Visit: Payer: Medicare Other | Admitting: Family Medicine

## 2023-05-17 VITALS — BP 136/70 | HR 85 | Temp 97.9°F | Wt 218.7 lb

## 2023-05-17 DIAGNOSIS — E785 Hyperlipidemia, unspecified: Secondary | ICD-10-CM

## 2023-05-17 DIAGNOSIS — E1165 Type 2 diabetes mellitus with hyperglycemia: Secondary | ICD-10-CM | POA: Diagnosis not present

## 2023-05-17 DIAGNOSIS — Z7984 Long term (current) use of oral hypoglycemic drugs: Secondary | ICD-10-CM

## 2023-05-17 LAB — POCT GLYCOSYLATED HEMOGLOBIN (HGB A1C): Hemoglobin A1C: 7 % — AB (ref 4.0–5.6)

## 2023-05-17 MED ORDER — TIRZEPATIDE 2.5 MG/0.5ML ~~LOC~~ SOAJ: 2.5000 mg | SUBCUTANEOUS | 1 refills | Status: DC

## 2023-05-17 NOTE — Patient Instructions (Signed)
Give me some feedback in one month regarding tolerance of Moujaro and we can titrate further.

## 2023-05-17 NOTE — Progress Notes (Signed)
Established Patient Office Visit  Subjective   Patient ID: Luis Ross, male    DOB: 04/19/1951  Age: 73 y.o. MRN: [id number]  Chief Complaint  Patient presents with   Medical Management of Chronic Issues    HPI   Luis Ross is seen today for medical follow-up.  He has type 2 diabetes and dyslipidemia.  Current medications include pravastatin, metformin extended release, losartan, Invokana, and Rybelsus.  He has had problems with diarrhea with taking full dose of metformin of 1000 mg twice daily.  Has scaled this back.  Queries whether we might should make possible medication change.  Has had climbing A1c on Rybelsus.  He has checked with insurance and should have coverage for [facility].  We had previously discussed that this would provide increased efficacy over oral GLP-1 medication.  He takes pravastatin for hyperlipidemia.  Recent LDL cholesterol 66.  He has been intolerant of other statins previously.  He is exercising several days per week at the gym.  He is on low-dose losartan more for renal protection.  Blood pressures have generally been well-controlled.  Recent urine microalbumin screen normal.  Past Medical History:  Diagnosis Date   HYPERLIPIDEMIA 02/26/2010   Past Surgical History:  Procedure Laterality Date   GANGLION CYST EXCISION  1964   left wrist    reports that he quit smoking about 11 years ago. His smoking use included cigarettes. He started smoking about 21 years ago. He has a 5 pack-year smoking history. He has never used smokeless tobacco. He reports that he does not drink alcohol and does not use drugs. family history includes Alcohol abuse in his father; Heart disease in his mother; Hypertension in his mother. Allergies  Allergen Reactions   Crestor [Rosuvastatin Calcium]     myalgias   Metformin And Related Diarrhea    Review of Systems  Constitutional:  Negative for malaise/fatigue.  Eyes:  Negative for blurred vision.  Respiratory:  Negative for  shortness of breath.   Cardiovascular:  Negative for chest pain.  Neurological:  Negative for dizziness, weakness and headaches.      Objective:     BP 136/70 (BP Location: Left Arm, Patient Position: Sitting, Cuff Size: Normal)   Pulse 85   Temp 97.9 F (36.6 C) (Oral)   Wt 218 lb 11.2 oz (99.2 kg)   SpO2 98%   BMI 28.08 kg/m  BP Readings from Last 3 Encounters:  05/17/23 136/70  02/10/23 120/76  10/04/22 128/62   Wt Readings from Last 3 Encounters:  05/17/23 218 lb 11.2 oz (99.2 kg)  04/12/23 204 lb (92.5 kg)  02/10/23 207 lb 11.2 oz (94.2 kg)      Physical Exam Vitals reviewed.  Constitutional:      Appearance: He is well-developed.  Neck:     Thyroid: No thyromegaly.  Cardiovascular:     Rate and Rhythm: Normal rate and regular rhythm.  Pulmonary:     Effort: Pulmonary effort is normal. No respiratory distress.     Breath sounds: Normal breath sounds. No wheezing or rales.  Neurological:     Mental Status: He is alert.      Results for orders placed or performed in visit on 05/17/23  POC HgB A1c  Result Value Ref Range   Hemoglobin A1C 7.0 (A) 4.0 - 5.6 %   HbA1c POC (<> result, manual entry)     HbA1c, POC (prediabetic range)     HbA1c, POC (controlled diabetic range)  Last CBC Lab Results  Component Value Date   WBC 5.7 09/30/2022   HGB 16.3 09/30/2022   HCT 48.3 09/30/2022   MCV 93.9 09/30/2022   RDW 13.1 09/30/2022   PLT 211.0 09/30/2022   Last metabolic panel Lab Results  Component Value Date   GLUCOSE 160 (H) 05/10/2023   NA 139 05/10/2023   K 4.3 05/10/2023   CL 103 05/10/2023   CO2 27 05/10/2023   BUN 17 05/10/2023   CREATININE 0.77 05/10/2023   GFR 90.56 05/10/2023   CALCIUM 9.5 05/10/2023   PROT 7.2 05/10/2023   ALBUMIN 4.7 05/10/2023   BILITOT 1.2 05/10/2023   ALKPHOS 47 05/10/2023   AST 19 05/10/2023   ALT 29 05/10/2023   Last lipids Lab Results  Component Value Date   CHOL 132 05/10/2023   HDL 41.50  05/10/2023   LDLCALC 66 05/10/2023   LDLDIRECT 122.0 06/03/2019   TRIG 126.0 05/10/2023   CHOLHDL 3 05/10/2023   Last hemoglobin A1c Lab Results  Component Value Date   HGBA1C 7.0 (A) 05/17/2023      The 10-year ASCVD risk score (Arnett DK, et al., 2019) is: 35.6%    Assessment & Plan:   #1 type 2 diabetes.  A1c today 7.0% which is up from 6.5% previously.  Intolerance with higher dose metformin.  Currently also takes Invokana and Rybelsus.  We discussed making a change with discontinuation of Rybelsus and start Mounjaro 2.5 mg subcutaneous once weekly and give feedback in 1 month to consider further titration.  Should see much better efficacy with Mounjaro.  #2 hyperlipidemia.  Recent LDL cholesterol 66.  Continue pravastatin 40 mg daily.   Return in about 3 months (around 08/15/2023).    Evelena Peat, MD

## 2023-06-12 ENCOUNTER — Encounter: Payer: Self-pay | Admitting: Family Medicine

## 2023-06-12 MED ORDER — TIRZEPATIDE 5 MG/0.5ML ~~LOC~~ SOAJ: 5.0000 mg | SUBCUTANEOUS | 0 refills | Status: DC

## 2023-06-12 MED ORDER — TIRZEPATIDE 5 MG/0.5ML ~~LOC~~ SOAJ
5.0000 mg | SUBCUTANEOUS | 0 refills | Status: DC
Start: 2023-06-12 — End: 2023-06-12

## 2023-06-12 NOTE — Addendum Note (Signed)
Addended by: Christy Sartorius on: 06/12/2023 02:12 PM   Modules accepted: Orders

## 2023-07-10 ENCOUNTER — Encounter: Payer: Self-pay | Admitting: Family Medicine

## 2023-07-10 MED ORDER — TIRZEPATIDE 10 MG/0.5ML ~~LOC~~ SOAJ: 10.0000 mg | SUBCUTANEOUS | 0 refills | Status: DC

## 2023-07-10 NOTE — Telephone Encounter (Signed)
 RX sent

## 2023-07-18 ENCOUNTER — Encounter: Payer: Self-pay | Admitting: Family Medicine

## 2023-07-18 LAB — HM DIABETES EYE EXAM

## 2023-08-06 ENCOUNTER — Encounter: Payer: Self-pay | Admitting: Family Medicine

## 2023-08-09 ENCOUNTER — Telehealth: Payer: Self-pay

## 2023-08-09 NOTE — Telephone Encounter (Signed)
 Copied from CRM [id number]. Topic: Clinical - Prescription Issue >> Aug 09, 2023  3:10 PM [place] J wrote: Reason for CRM: Patient is calling in regarding tirzepatide [facility]). Patient reached out for a refill on the 13th of April but has not received an update on completion. Patient was wondering if this could be sent to the pharmacy by today since he goes out of town tomorrow. He was also wondering if his dosage could be changes to 12.5MG  subcutaneously, 4x weekly.

## 2023-08-10 ENCOUNTER — Other Ambulatory Visit: Payer: Self-pay | Admitting: Family Medicine

## 2023-08-10 NOTE — Telephone Encounter (Signed)
 Copied from CRM [id number]. Topic: Clinical - Medication Refill >> Aug 10, 2023 10:07 AM Dimple Francis wrote: Most Recent Primary Care Visit:  Provider: Marquetta Sit  Department: [facility]  Visit Type: OFFICE VISIT  Date: 05/17/2023  Medication: tirzepatide (MOUNJARO) 10 MG/0.5ML Pen  Has the patient contacted their pharmacy? Yes (Agent: If no, request that the patient contact the pharmacy for the refill. If patient does not wish to contact the pharmacy document the reason why and proceed with request.) (Agent: If yes, when and what did the pharmacy advise?)  Is this the correct pharmacy for this prescription? Yes If no, delete pharmacy and type the correct one.  This is the patient's preferred pharmacy:   [facility] [id number] - [place], [place] - [address] [place] [address] Phone: [phone] Fax: [phone]   Has the prescription been filled recently? Yes  Is the patient out of the medication? Yes  Has the patient been seen for an appointment in the last year OR does the patient have an upcoming appointment? Yes  Can we respond through MyChart? Yes  Agent: Please be advised that Rx refills may take up to 3 business days. We ask that you follow-up with your pharmacy.

## 2023-08-11 ENCOUNTER — Encounter: Payer: Self-pay | Admitting: Family Medicine

## 2023-08-14 ENCOUNTER — Telehealth: Payer: Self-pay

## 2023-08-14 MED ORDER — TIRZEPATIDE 12.5 MG/0.5ML ~~LOC~~ SOAJ: 12.5000 mg | SUBCUTANEOUS | 0 refills | Status: DC

## 2023-08-14 NOTE — Telephone Encounter (Signed)
 Rx sent

## 2023-08-14 NOTE — Telephone Encounter (Signed)
 Please see Mychart encounter from 08/06/2023

## 2023-08-14 NOTE — Telephone Encounter (Signed)
 Copied from CRM [id number]. Topic: Clinical - Medication Refill >> Aug 10, 2023 10:07 AM Dimple Francis wrote: Most Recent Primary Care Visit:  Provider: Marquetta Sit  Department: [facility]  Visit Type: OFFICE VISIT  Date: 05/17/2023  Medication: tirzepatide (MOUNJARO) 10 MG/0.5ML Pen  Has the patient contacted their pharmacy? Yes (Agent: If no, request that the patient contact the pharmacy for the refill. If patient does not wish to contact the pharmacy document the reason why and proceed with request.) (Agent: If yes, when and what did the pharmacy advise?)  Is this the correct pharmacy for this prescription? Yes If no, delete pharmacy and type the correct one.  This is the patient's preferred pharmacy:   [facility] [id number] - [place], [place] - [address] [place] [address] Phone: [phone] Fax: [phone]   Has the prescription been filled recently? Yes  Is the patient out of the medication? Yes  Has the patient been seen for an appointment in the last year OR does the patient have an upcoming appointment? Yes  Can we respond through MyChart? Yes  Agent: Please be advised that Rx refills may take up to 3 business days. We ask that you follow-up with your pharmacy. >> Aug 14, 2023  8:11 AM Emylou G wrote: Adv patient.Luis Ross a message sent was sent for refill.. he wants it to be sent to [facility] 12.5 mg.. number on file good.. he needs for 4 weeks, and may need to titrate again

## 2023-08-14 NOTE — Addendum Note (Signed)
 Addended by: Aurelio Leer on: 08/14/2023 11:02 AM   Modules accepted: Orders

## 2023-08-16 ENCOUNTER — Ambulatory Visit (INDEPENDENT_AMBULATORY_CARE_PROVIDER_SITE_OTHER): Payer: Medicare Other | Admitting: Family Medicine

## 2023-08-16 VITALS — BP 120/60 | HR 91 | Temp 97.8°F | Wt 204.3 lb

## 2023-08-16 DIAGNOSIS — Z7985 Long-term (current) use of injectable non-insulin antidiabetic drugs: Secondary | ICD-10-CM

## 2023-08-16 DIAGNOSIS — E785 Hyperlipidemia, unspecified: Secondary | ICD-10-CM | POA: Diagnosis not present

## 2023-08-16 DIAGNOSIS — E1165 Type 2 diabetes mellitus with hyperglycemia: Secondary | ICD-10-CM

## 2023-08-16 LAB — LIPID PANEL
Cholesterol: 133 mg/dL (ref 0–200)
HDL: 49.5 mg/dL (ref >39.00)
LDL Cholesterol: 65 mg/dL (ref 0–99)
NonHDL: 83.39
Total CHOL/HDL Ratio: 3
Triglycerides: 91 mg/dL (ref 0.0–149.0)
VLDL: 18.2 mg/dL (ref 0.0–40.0)

## 2023-08-16 LAB — POCT GLYCOSYLATED HEMOGLOBIN (HGB A1C): Hemoglobin A1C: 6.5 % — AB (ref 4.0–5.6)

## 2023-08-16 LAB — COMPREHENSIVE METABOLIC PANEL WITH GFR
ALT: 37 U/L (ref 0–53)
AST: 26 U/L (ref 0–37)
Albumin: 4.9 g/dL (ref 3.5–5.2)
Alkaline Phosphatase: 52 U/L (ref 39–117)
BUN: 15 mg/dL (ref 6–23)
CO2: 27 meq/L (ref 19–32)
Calcium: 10 mg/dL (ref 8.4–10.5)
Chloride: 102 meq/L (ref 96–112)
Creatinine, Ser: 0.92 mg/dL (ref 0.40–1.50)
GFR: 83.98 mL/min (ref >60.00)
Glucose, Bld: 126 mg/dL — ABNORMAL HIGH (ref 70–99)
Potassium: 5.1 meq/L (ref 3.5–5.1)
Sodium: 139 meq/L (ref 135–145)
Total Bilirubin: 1.2 mg/dL (ref 0.2–1.2)
Total Protein: 7.8 g/dL (ref 6.0–8.3)

## 2023-08-16 NOTE — Progress Notes (Signed)
 Established Patient Office Visit  Subjective   Patient ID: Luis Ross, male    DOB: [birth year]  Age: 73 y.o. MRN: [id number]  Chief Complaint  Patient presents with   Medical Management of Chronic Issues    HPI   Luis Ross is here for medical follow up.  Generally doing well.  He had developed some increasing diarrhea even with lower dose metformin  extended release and had to discontinue that about a month ago.  He had called recently requesting increase in Mounjaro to 12.5 mg weekly largely because of coming off the metformin .  His glucoses been stable.  A1c today 6.5%.  Has lost about 6 pounds since last visit based on his scales.  He has some recent mild hypotension with 1 systolic reading less than 100 and he reduced Cozaar  from 25 to 12.5 mg daily.  Has not had any consistent dizziness since then.  Going to the gym regularly.  No recent chest pains.  Intolerance with many statins but still taking pravastatin  which she has tolerated.  Also did not tolerate Zetia .  Last LDL cholesterol 66.  Would like to recheck fasting lipids today since he has been on the Mounjaro to see if this is had any significant additional impact.  His mom had coronary disease but was up in age at the time.  No other premature CAD history.  Recent urine microalbumin screen was normal.  Past Medical History:  Diagnosis Date   HYPERLIPIDEMIA 02/26/2010   Past Surgical History:  Procedure Laterality Date   GANGLION CYST EXCISION  1964   left wrist    reports that he quit smoking about 11 years ago. His smoking use included cigarettes. He started smoking about 21 years ago. He has a 5 pack-year smoking history. He has never used smokeless tobacco. He reports that he does not drink alcohol and does not use drugs. family history includes Alcohol abuse in his father; Heart disease in his mother; Hypertension in his mother. Allergies  Allergen Reactions   Crestor [Rosuvastatin Calcium]     myalgias    Metformin  And Related Diarrhea    Review of Systems  Constitutional:  Negative for malaise/fatigue.  Eyes:  Negative for blurred vision.  Respiratory:  Negative for shortness of breath.   Cardiovascular:  Negative for chest pain.  Neurological:  Negative for weakness and headaches.      Objective:     BP 120/60 (BP Location: Left Arm, Patient Position: Sitting, Cuff Size: Normal)   Pulse 91   Temp 97.8 F (36.6 C) (Oral)   Wt 204 lb 4.8 oz (92.7 kg)   SpO2 96%   BMI 26.23 kg/m  BP Readings from Last 3 Encounters:  08/16/23 120/60  05/17/23 136/70  02/10/23 120/76   Wt Readings from Last 3 Encounters:  08/16/23 204 lb 4.8 oz (92.7 kg)  05/17/23 218 lb 11.2 oz (99.2 kg)  04/12/23 204 lb (92.5 kg)      Physical Exam Vitals reviewed.  Constitutional:      General: He is not in acute distress.    Appearance: He is well-developed. He is not ill-appearing.  Neck:     Thyroid : No thyromegaly.  Cardiovascular:     Rate and Rhythm: Normal rate and regular rhythm.     Heart sounds: No murmur heard.    No gallop.  Pulmonary:     Effort: Pulmonary effort is normal. No respiratory distress.     Breath sounds: Normal breath sounds. No wheezing or rales.  Musculoskeletal:     Cervical back: Neck supple.     Right lower leg: No edema.     Left lower leg: No edema.  Neurological:     Mental Status: He is alert and oriented to person, place, and time.      Results for orders placed or performed in visit on 08/16/23  POC HgB A1c  Result Value Ref Range   Hemoglobin A1C 6.5 (A) 4.0 - 5.6 %   HbA1c POC (<> result, manual entry)     HbA1c, POC (prediabetic range)     HbA1c, POC (controlled diabetic range)        The 10-year ASCVD risk score (Arnett DK, et al., 2019) is: 31.8%    Assessment & Plan:   #1 type 2 diabetes controlled with A1c today 6.5%.  Recent urine microalbumin screen normal.  Continue yearly diabetic eye exam.  Recent titration of Mounjaro to 12.5  mg once weekly and tolerating well.  Had to discontinue metformin  secondary to diarrhea.  Reassess A1c in 3 months  #2 patient on low-dose ARB for renal protection.  Had reduced dose to 12.5 mg of losartan  secondary to some hypotension which probably reflects some recent weight loss.  Continue current low-dose of losartan .  #3 hyperlipidemia.  On pravastatin .  Intolerance to multiple other statins and also Zetia .  We did discuss other possible risk stratify her such as coronary calcium score and LP(a) but at this point would probably not greatly change therapy.  Future lab order placed for lipid and CMP in 3 months No follow-ups on file.    Glean Lamy, MD

## 2023-08-16 NOTE — Addendum Note (Signed)
 Addended by: Aurelio Leer on: 08/16/2023 09:45 AM   Modules accepted: Orders

## 2023-08-18 ENCOUNTER — Encounter: Payer: Self-pay | Admitting: Family Medicine

## 2023-08-18 MED ORDER — ONETOUCH VERIO VI STRP: ORAL_STRIP | 3 refills | Status: DC

## 2023-08-18 NOTE — Addendum Note (Signed)
 Addended by: Aurelio Leer on: 08/18/2023 04:27 PM   Modules accepted: Orders

## 2023-08-21 MED ORDER — ONETOUCH VERIO VI STRP: ORAL_STRIP | 3 refills | Status: AC

## 2023-08-21 NOTE — Addendum Note (Signed)
 Addended by: Aurelio Leer on: 08/21/2023 01:39 PM   Modules accepted: Orders

## 2023-08-24 ENCOUNTER — Other Ambulatory Visit: Payer: Self-pay | Admitting: Family Medicine

## 2023-10-03 ENCOUNTER — Other Ambulatory Visit: Payer: Self-pay | Admitting: Family Medicine

## 2023-10-30 ENCOUNTER — Encounter: Payer: Self-pay | Admitting: Family Medicine

## 2023-10-30 MED ORDER — TIRZEPATIDE 12.5 MG/0.5ML ~~LOC~~ SOAJ: 12.5000 mg | SUBCUTANEOUS | 0 refills | Status: DC

## 2023-11-15 ENCOUNTER — Other Ambulatory Visit

## 2023-11-15 ENCOUNTER — Ambulatory Visit: Payer: Self-pay | Admitting: Family Medicine

## 2023-11-15 DIAGNOSIS — E1165 Type 2 diabetes mellitus with hyperglycemia: Secondary | ICD-10-CM

## 2023-11-15 DIAGNOSIS — E785 Hyperlipidemia, unspecified: Secondary | ICD-10-CM

## 2023-11-15 LAB — COMPREHENSIVE METABOLIC PANEL WITH GFR
ALT: 17 U/L (ref 0–53)
AST: 15 U/L (ref 0–37)
Albumin: 4.6 g/dL (ref 3.5–5.2)
Alkaline Phosphatase: 47 U/L (ref 39–117)
BUN: 16 mg/dL (ref 6–23)
CO2: 27 meq/L (ref 19–32)
Calcium: 9.5 mg/dL (ref 8.4–10.5)
Chloride: 104 meq/L (ref 96–112)
Creatinine, Ser: 0.92 mg/dL (ref 0.40–1.50)
GFR: 83.84 mL/min (ref >60.00)
Glucose, Bld: 130 mg/dL — ABNORMAL HIGH (ref 70–99)
Potassium: 4.7 meq/L (ref 3.5–5.1)
Sodium: 139 meq/L (ref 135–145)
Total Bilirubin: 0.8 mg/dL (ref 0.2–1.2)
Total Protein: 7.5 g/dL (ref 6.0–8.3)

## 2023-11-15 LAB — LIPID PANEL
Cholesterol: 100 mg/dL (ref 0–200)
HDL: 46.4 mg/dL (ref >39.00)
LDL Cholesterol: 43 mg/dL (ref 0–99)
NonHDL: 53.86
Total CHOL/HDL Ratio: 2
Triglycerides: 52 mg/dL (ref 0.0–149.0)
VLDL: 10.4 mg/dL (ref 0.0–40.0)

## 2023-11-15 LAB — HEMOGLOBIN A1C: Hgb A1c MFr Bld: 5.9 % (ref 4.6–6.5)

## 2023-11-22 ENCOUNTER — Ambulatory Visit: Admitting: Family Medicine

## 2023-11-24 ENCOUNTER — Ambulatory Visit: Admitting: Family Medicine

## 2023-11-24 ENCOUNTER — Encounter: Payer: Self-pay | Admitting: Family Medicine

## 2023-11-24 VITALS — BP 120/72 | HR 98 | Temp 98.5°F | Wt 189.8 lb

## 2023-11-24 DIAGNOSIS — Z7985 Long-term (current) use of injectable non-insulin antidiabetic drugs: Secondary | ICD-10-CM | POA: Diagnosis not present

## 2023-11-24 DIAGNOSIS — E1165 Type 2 diabetes mellitus with hyperglycemia: Secondary | ICD-10-CM | POA: Diagnosis not present

## 2023-11-24 DIAGNOSIS — E785 Hyperlipidemia, unspecified: Secondary | ICD-10-CM

## 2023-11-24 DIAGNOSIS — K4090 Unilateral inguinal hernia, without obstruction or gangrene, not specified as recurrent: Secondary | ICD-10-CM | POA: Diagnosis not present

## 2023-11-24 DIAGNOSIS — Z7984 Long term (current) use of oral hypoglycemic drugs: Secondary | ICD-10-CM

## 2023-11-24 NOTE — Progress Notes (Signed)
 Established Patient Office Visit  Subjective   Patient ID: Luis Ross, male    DOB: 08-20-1950  Age: 73 y.o. MRN: [id number]  Chief Complaint  Patient presents with   Medical Management of Chronic Issues    HPI   [name] is seen for medical follow-up.  Has continued to lose some weight on Mounjaro .  Was seen in April and weighed 204 pounds compared with current weight around 190 pounds.  He is also exercising regularly at the gym.  Feels well overall.  A1c from recent lab improved further to 5.9%.  He discontinued metformin .  Had some previous GI side effects.  He also had to stop losartan  because of some lightheadedness after losing the weight.  He currently remains on Mounjaro  12.5 mg subcutaneous once weekly, pravastatin  40 mg daily, Invokana  300 mg daily.  Recent cholesterol 100, triglycerides 52, HDL 46, and LDL 43  Does have new issue of right inguinal hernia.  1 day after he got back from the gym was in the shower noticed a bulge right inguinal region.  Easily reducible.  Nonpainful.  Upcoming trip to [place] in October and debating whether he should see surgeon prior to then.  No prior history of hernia  Past Medical History:  Diagnosis Date   HYPERLIPIDEMIA 02/26/2010   Past Surgical History:  Procedure Laterality Date   GANGLION CYST EXCISION  1964   left wrist    reports that he quit smoking about 12 years ago. His smoking use included cigarettes. He started smoking about 22 years ago. He has a 5 pack-year smoking history. He has never used smokeless tobacco. He reports that he does not drink alcohol and does not use drugs. family history includes Alcohol abuse in his father; Heart disease in his mother; Hypertension in his mother. Allergies  Allergen Reactions   Crestor [Rosuvastatin Calcium]     myalgias   Metformin  And Related Diarrhea    Review of Systems  Constitutional:  Negative for malaise/fatigue.  Eyes:  Negative for blurred vision.  Respiratory:   Negative for shortness of breath.   Cardiovascular:  Negative for chest pain.  Gastrointestinal:  Negative for abdominal pain.  Neurological:  Negative for weakness and headaches.      Objective:     BP 120/72   Pulse 98   Temp 98.5 F (36.9 C) (Oral)   Wt 189 lb 12.8 oz (86.1 kg)   SpO2 95%   BMI 24.37 kg/m  BP Readings from Last 3 Encounters:  11/24/23 120/72  08/16/23 120/60  05/17/23 136/70   Wt Readings from Last 3 Encounters:  11/24/23 189 lb 12.8 oz (86.1 kg)  08/16/23 204 lb 4.8 oz (92.7 kg)  05/17/23 218 lb 11.2 oz (99.2 kg)      Physical Exam Vitals reviewed.  Constitutional:      General: He is not in acute distress.    Appearance: He is well-developed. He is not ill-appearing.  HENT:     Right Ear: External ear normal.     Left Ear: External ear normal.  Eyes:     Pupils: Pupils are equal, round, and reactive to light.  Neck:     Thyroid : No thyromegaly.  Cardiovascular:     Rate and Rhythm: Normal rate and regular rhythm.  Pulmonary:     Effort: Pulmonary effort is normal. No respiratory distress.     Breath sounds: Normal breath sounds. No wheezing or rales.  Genitourinary:    Comments: Obvious right inguinal bulge.  Soft and nontender and easily reducible. Musculoskeletal:     Cervical back: Neck supple.  Skin:    Comments: Feet reveal no skin lesions. Good distal foot pulses. Good capillary refill. No calluses. Normal sensation with monofilament testing   Neurological:     Mental Status: He is alert and oriented to person, place, and time.      No results found for any visits on 11/24/23.  Last CBC Lab Results  Component Value Date   WBC 5.7 09/30/2022   HGB 16.3 09/30/2022   HCT 48.3 09/30/2022   MCV 93.9 09/30/2022   RDW 13.1 09/30/2022   PLT 211.0 09/30/2022   Last metabolic panel Lab Results  Component Value Date   GLUCOSE 130 (H) 11/15/2023   NA 139 11/15/2023   K 4.7 11/15/2023   CL 104 11/15/2023   CO2 27 11/15/2023    BUN 16 11/15/2023   CREATININE 0.92 11/15/2023   GFR 83.84 11/15/2023   CALCIUM 9.5 11/15/2023   PROT 7.5 11/15/2023   ALBUMIN 4.6 11/15/2023   BILITOT 0.8 11/15/2023   ALKPHOS 47 11/15/2023   AST 15 11/15/2023   ALT 17 11/15/2023   Last lipids Lab Results  Component Value Date   CHOL 100 11/15/2023   HDL 46.40 11/15/2023   LDLCALC 43 11/15/2023   LDLDIRECT 122.0 06/03/2019   TRIG 52.0 11/15/2023   CHOLHDL 2 11/15/2023   Last hemoglobin A1c Lab Results  Component Value Date   HGBA1C 5.9 11/15/2023      The ASCVD Risk score (Arnett DK, et al., 2019) failed to calculate for the following reasons:   The valid total cholesterol range is 130 to 320 mg/dL    Assessment & Plan:   Problem List Items Addressed This Visit       Unprioritized   Dyslipidemia   Relevant Orders   Lipid panel   CMP   Type 2 diabetes mellitus (HCC) - Primary   Relevant Orders   Microalbumin / creatinine urine ratio   Hemoglobin A1c   Other Visit Diagnoses       Right inguinal hernia       Relevant Orders   Ambulatory referral to General Surgery     Type 2 diabetes well-controlled with A1c 5.9%.  Continue Mounjaro  and Invokana .  He was able to come off metformin  which had caused some side effects.  Lipids well-controlled on pravastatin   Right inguinal hernia.  Placed referral to general surgeon for further evaluation.  He stays very active and would like to get this repaired at some point  Routine follow-up in 3 months and sooner as needed  No follow-ups on file.    [name], MD

## 2024-01-23 ENCOUNTER — Encounter: Payer: Self-pay | Admitting: Family Medicine

## 2024-01-23 MED ORDER — TIRZEPATIDE 10 MG/0.5ML ~~LOC~~ SOAJ: 10.0000 mg | SUBCUTANEOUS | 3 refills | Status: DC

## 2024-01-26 ENCOUNTER — Other Ambulatory Visit: Payer: Self-pay | Admitting: Family Medicine

## 2024-01-28 ENCOUNTER — Encounter: Payer: Self-pay | Admitting: Family Medicine

## 2024-01-30 MED ORDER — CANAGLIFLOZIN 100 MG PO TABS: 100.0000 mg | ORAL_TABLET | Freq: Every day | ORAL | 3 refills | Status: AC

## 2024-02-19 ENCOUNTER — Other Ambulatory Visit

## 2024-02-19 ENCOUNTER — Ambulatory Visit: Payer: Self-pay | Admitting: Family Medicine

## 2024-02-19 DIAGNOSIS — E1165 Type 2 diabetes mellitus with hyperglycemia: Secondary | ICD-10-CM | POA: Diagnosis not present

## 2024-02-19 DIAGNOSIS — E785 Hyperlipidemia, unspecified: Secondary | ICD-10-CM

## 2024-02-19 LAB — COMPREHENSIVE METABOLIC PANEL WITH GFR
ALT: 17 U/L (ref 0–53)
AST: 20 U/L (ref 0–37)
Albumin: 4.5 g/dL (ref 3.5–5.2)
Alkaline Phosphatase: 48 U/L (ref 39–117)
BUN: 12 mg/dL (ref 6–23)
CO2: 25 meq/L (ref 19–32)
Calcium: 9 mg/dL (ref 8.4–10.5)
Chloride: 103 meq/L (ref 96–112)
Creatinine, Ser: 0.75 mg/dL (ref 0.40–1.50)
GFR: 90.78 mL/min (ref >60.00)
Glucose, Bld: 127 mg/dL — ABNORMAL HIGH (ref 70–99)
Potassium: 4 meq/L (ref 3.5–5.1)
Sodium: 138 meq/L (ref 135–145)
Total Bilirubin: 1.2 mg/dL (ref 0.2–1.2)
Total Protein: 7.4 g/dL (ref 6.0–8.3)

## 2024-02-19 LAB — HEMOGLOBIN A1C: Hgb A1c MFr Bld: 5.6 % (ref 4.6–6.5)

## 2024-02-19 LAB — LIPID PANEL
Cholesterol: 113 mg/dL (ref 0–200)
HDL: 53.5 mg/dL (ref >39.00)
LDL Cholesterol: 48 mg/dL (ref 0–99)
NonHDL: 59
Total CHOL/HDL Ratio: 2
Triglycerides: 57 mg/dL (ref 0.0–149.0)
VLDL: 11.4 mg/dL (ref 0.0–40.0)

## 2024-02-19 LAB — MICROALBUMIN / CREATININE URINE RATIO
Creatinine,U: 87 mg/dL
Microalb Creat Ratio: 10.4 mg/g (ref 0.0–30.0)
Microalb, Ur: 0.9 mg/dL (ref 0.0–1.9)

## 2024-02-26 ENCOUNTER — Ambulatory Visit: Admitting: Family Medicine

## 2024-02-26 VITALS — BP 122/70 | HR 94 | Wt 182.6 lb

## 2024-02-26 DIAGNOSIS — E785 Hyperlipidemia, unspecified: Secondary | ICD-10-CM

## 2024-02-26 DIAGNOSIS — Z7985 Long-term (current) use of injectable non-insulin antidiabetic drugs: Secondary | ICD-10-CM | POA: Diagnosis not present

## 2024-02-26 DIAGNOSIS — E1165 Type 2 diabetes mellitus with hyperglycemia: Secondary | ICD-10-CM

## 2024-02-26 NOTE — Progress Notes (Signed)
 Established Patient Office Visit  Subjective   Patient ID: Luis Ross, male    DOB: 1950/08/13  Age: 73 y.o. MRN: [id number]  Chief Complaint  Patient presents with   Medical Management of Chronic Issues    HPI   Luis Ross is seen today for chronic medical follow-up.  He has lost more weight since last visit on Mounjaro  and we had reduced his dose from 12.5-10.  He also reduced Invokana  from 300-100.  His weight has stabilized over the past few weeks.  He plans to meet up with a trainer and start back some resistance training soon.  Had recent labs last week with A1c 5.6%.  Lipids very well-controlled with LDL cholesterol 48.Luis Ross  Urine microalbumin screen was normal.  He is getting regular eye exams.  He had some low blood pressure issues with the weight loss but this seems to have stabilized.  No longer taking losartan .  He was taking low-dose losartan  for renal protection.  Currently has no orthostatic symptoms.  Past Medical History:  Diagnosis Date   HYPERLIPIDEMIA 02/26/2010   Past Surgical History:  Procedure Laterality Date   GANGLION CYST EXCISION  1964   left wrist    reports that he quit smoking about 12 years ago. His smoking use included cigarettes. He started smoking about 22 years ago. He has a 5 pack-year smoking history. He has never used smokeless tobacco. He reports that he does not drink alcohol and does not use drugs. family history includes Alcohol abuse in his father; Heart disease in his mother; Hypertension in his mother. Allergies  Allergen Reactions   Crestor [Rosuvastatin Calcium]     myalgias   Metformin  And Related Diarrhea    Review of Systems  Constitutional:  Negative for malaise/fatigue.  Eyes:  Negative for blurred vision.  Respiratory:  Negative for shortness of breath.   Cardiovascular:  Negative for chest pain.  Neurological:  Negative for dizziness, weakness and headaches.      Objective:     BP 122/70   Pulse 94   Wt 182 lb 9.6 oz  (82.8 kg)   SpO2 97%   BMI 23.44 kg/m  BP Readings from Last 3 Encounters:  02/26/24 122/70  11/24/23 120/72  08/16/23 120/60   Wt Readings from Last 3 Encounters:  02/26/24 182 lb 9.6 oz (82.8 kg)  11/24/23 189 lb 12.8 oz (86.1 kg)  08/16/23 204 lb 4.8 oz (92.7 kg)      Physical Exam Vitals reviewed.  Constitutional:      General: He is not in acute distress.    Appearance: He is well-developed. He is not ill-appearing.  Eyes:     Pupils: Pupils are equal, round, and reactive to light.  Neck:     Thyroid : No thyromegaly.  Cardiovascular:     Rate and Rhythm: Normal rate and regular rhythm.  Pulmonary:     Effort: Pulmonary effort is normal. No respiratory distress.     Breath sounds: Normal breath sounds. No wheezing or rales.  Musculoskeletal:     Cervical back: Neck supple.  Neurological:     Mental Status: He is alert and oriented to person, place, and time.      No results found for any visits on 02/26/24.  Last CBC Lab Results  Component Value Date   WBC 5.7 09/30/2022   HGB 16.3 09/30/2022   HCT 48.3 09/30/2022   MCV 93.9 09/30/2022   RDW 13.1 09/30/2022   PLT 211.0 09/30/2022  Last metabolic panel Lab Results  Component Value Date   GLUCOSE 127 (H) 02/19/2024   NA 138 02/19/2024   K 4.0 02/19/2024   CL 103 02/19/2024   CO2 25 02/19/2024   BUN 12 02/19/2024   CREATININE 0.75 02/19/2024   GFR 90.78 02/19/2024   CALCIUM 9.0 02/19/2024   PROT 7.4 02/19/2024   ALBUMIN 4.5 02/19/2024   BILITOT 1.2 02/19/2024   ALKPHOS 48 02/19/2024   AST 20 02/19/2024   ALT 17 02/19/2024   Last lipids Lab Results  Component Value Date   CHOL 113 02/19/2024   HDL 53.50 02/19/2024   LDLCALC 48 02/19/2024   LDLDIRECT 122.0 06/03/2019   TRIG 57.0 02/19/2024   CHOLHDL 2 02/19/2024   Last hemoglobin A1c Lab Results  Component Value Date   HGBA1C 5.6 02/19/2024      The ASCVD Risk score (Arnett DK, et al., 2019) failed to calculate for the following  reasons:   The valid total cholesterol range is 130 to 320 mg/dL    Assessment & Plan:   Problem List Items Addressed This Visit       Unprioritized   Dyslipidemia   Relevant Orders   Lipid panel   CMP   Type 2 diabetes mellitus (HCC) - Primary   Relevant Orders   Hemoglobin A1c  Diabetes very well-controlled with recent A1c is 5.6%.  He had some recent orthostatic symptoms probably related to his weight loss.  This seems to have stabilized.  His weight has stabilized over the past few weeks.  We did discuss possible further reduction of Mounjaro  to 7.5 mg and he will observe weight over the next month.  We decided to leave him at 10 mg/week currently.  He plans to start more resistance training.  We discussed importance of getting adequate protein with suggestion of at least 90-120 g/day.  Return in about 3 months (around 05/28/2024).    Luis Scarlet, MD

## 2024-03-09 ENCOUNTER — Other Ambulatory Visit: Payer: Self-pay | Admitting: Family Medicine

## 2024-03-19 ENCOUNTER — Other Ambulatory Visit: Payer: Self-pay | Admitting: Family Medicine

## 2024-04-03 ENCOUNTER — Encounter: Payer: Self-pay | Admitting: Family Medicine

## 2024-04-04 MED ORDER — TIRZEPATIDE 7.5 MG/0.5ML ~~LOC~~ SOAJ: 7.5000 mg | SUBCUTANEOUS | 0 refills | Status: AC

## 2024-04-19 ENCOUNTER — Ambulatory Visit: Payer: Medicare Other

## 2024-04-19 VITALS — BP 120/60 | HR 82 | Temp 98.6°F | Ht 74.0 in | Wt 186.6 lb

## 2024-04-19 DIAGNOSIS — Z0000 Encounter for general adult medical examination without abnormal findings: Secondary | ICD-10-CM | POA: Diagnosis not present

## 2024-04-19 NOTE — Patient Instructions (Addendum)
 Luis Ross,  Thank you for taking the time for your Medicare Wellness Visit. I appreciate your continued commitment to your health goals. Please review the care plan we discussed, and feel free to reach out if I can assist you further.  Please note that Annual Wellness Visits do not include a physical exam. Some assessments may be limited, especially if the visit was conducted virtually. If needed, we may recommend an in-person follow-up with your provider.  Ongoing Care Seeing your primary care provider every 3 to 6 months helps us  monitor your health and provide consistent, personalized care.   Referrals If a referral was made during today's visit and you haven't received any updates within two weeks, please contact the referred provider directly to check on the status.  Recommended Screenings:  Health Maintenance  Topic Date Due   Complete foot exam   02/24/2023   COVID-19 Vaccine (10 - 2025-26 season) 07/14/2024   Eye exam for diabetics  07/17/2024   Hemoglobin A1C  08/19/2024   Yearly kidney function blood test for diabetes  02/18/2025   Yearly kidney health urinalysis for diabetes  02/18/2025   Medicare Annual Wellness Visit  04/19/2025   Colon Cancer Screening  01/06/2026   DTaP/Tdap/Td vaccine (4 - Td or Tdap) 01/30/2031   Pneumococcal Vaccine for age over 72  Completed   Hepatitis C Screening  Completed   Zoster (Shingles) Vaccine  Completed   Meningitis B Vaccine  Aged Out   Flu Shot  Discontinued       04/19/2024    8:48 AM  Advanced Directives  Does Patient Have a Medical Advance Directive? Yes  Type of Estate Agent of [place];Living will  Does patient want to make changes to medical advance directive? No - Patient declined  Copy of Healthcare Power of Attorney in Chart? No - copy requested    Vision: Annual vision screenings are recommended for early detection of glaucoma, cataracts, and diabetic retinopathy. These exams can also reveal  signs of chronic conditions such as diabetes and high blood pressure.  Dental: Annual dental screenings help detect early signs of oral cancer, gum disease, and other conditions linked to overall health, including heart disease and diabetes.  Please see the attached documents for additional preventive care recommendations.

## 2024-04-19 NOTE — Progress Notes (Signed)
 "  Chief Complaint  Patient presents with   Medicare Wellness     Subjective:   Luis Ross is a 73 y.o. male who presents for a Medicare Annual Wellness Visit.  Visit info / Clinical Intake: Medicare Wellness Visit Type:: Subsequent Annual Wellness Visit Persons participating in visit and providing information:: patient Medicare Wellness Visit Mode:: In-person (required for WTM) Interpreter Needed?: No Pre-visit prep was completed: yes AWV questionnaire completed by patient prior to visit?: yes Date:: 04/18/24 Living arrangements:: lives with spouse/significant other Patient's Overall Health Status Rating: excellent Typical amount of pain: none Does pain affect daily life?: no Are you currently prescribed opioids?: no  Dietary Habits and Nutritional Risks How many meals a day?: 3 Eats fruit and vegetables daily?: yes Most meals are obtained by: preparing own meals Diabetic:: (!) yes Any non-healing wounds?: no How often do you check your BS?: 1 (Weekly) Would you like to be referred to a Nutritionist or for Diabetic Management? : no  Functional Status Activities of Daily Living (to include ambulation/medication): Independent Ambulation: Independent with device- listed below Home Assistive Devices/Equipment: Eyeglasses Medication Administration: Independent Home Management (perform basic housework or laundry): Independent Manage your own finances?: yes Primary transportation is: driving Concerns about vision?: no *vision screening is required for WTM* Concerns about hearing?: no  Fall Screening Falls in the past year?: 0 Number of falls in past year: 0 Was there an injury with Fall?: 0 Fall Risk Category Calculator: 0 Patient Fall Risk Level: Low Fall Risk  Fall Risk Patient at Risk for Falls Due to: No Fall Risks Fall risk Follow up: Falls evaluation completed  Home and Transportation Safety: All rugs have non-skid backing?: yes All stairs or steps have  railings?: yes Grab bars in the bathtub or shower?: yes Have non-skid surface in bathtub or shower?: yes Good home lighting?: yes Regular seat belt use?: yes Hospital stays in the last year:: no  Cognitive Assessment Difficulty concentrating, remembering, or making decisions? : no Will 6CIT or Mini Cog be Completed: yes What year is it?: 0 points What month is it?: 0 points Give patient an address phrase to remember (5 components): [address] [place]  About what time is it?: 0 points Count backwards from 20 to 1: 0 points Say the months of the year in reverse: 0 points Repeat the address phrase from earlier: 0 points 6 CIT Score: 0 points  Advance Directives (For Healthcare) Does Patient Have a Medical Advance Directive?: Yes Does patient want to make changes to medical advance directive?: No - Patient declined Type of Advance Directive: Healthcare Power of [place]; Living will Copy of Healthcare Power of Attorney in Chart?: No - copy requested Copy of Living Will in Chart?: No - copy requested  Reviewed/Updated  Reviewed/Updated: Reviewed All (Medical, Surgical, Family, Medications, Allergies, Care Teams, Patient Goals)    Allergies (verified) Crestor [rosuvastatin calcium] and Metformin  and related   Current Medications (verified) Outpatient Encounter Medications as of 04/19/2024  Medication Sig   doxycycline  (VIBRAMYCIN ) 100 MG capsule TAKE ONE CAPSULE BY MOUTH TWICE DAILY   Blood Glucose Monitoring Suppl (ONETOUCH VERIO FLEX SYSTEM) w/Device KIT Use to test blood glucose 2 times daily.   canagliflozin  (INVOKANA ) 100 MG TABS tablet Take 1 tablet (100 mg total) by mouth daily before breakfast.   cyclobenzaprine  (FLEXERIL ) 10 MG tablet Take 1 tablet (10 mg total) by mouth 3 (three) times daily as needed for muscle spasms.   glucose blood (ONETOUCH VERIO) test strip USE  AS DIRECTED TO TEST BLOOD SUGAR TWO TIMES A DAY   OneTouch Delica Lancets 33G MISC Use to test  blood glucose 2 times daily.   pravastatin  (PRAVACHOL ) 40 MG tablet Take 1 tablet (40 mg total) by mouth daily.   tirzepatide  (MOUNJARO ) 7.5 MG/0.5ML Pen Inject 7.5 mg into the skin once a week.   No facility-administered encounter medications on file as of 04/19/2024.    History: Past Medical History:  Diagnosis Date   HYPERLIPIDEMIA 02/26/2010   Past Surgical History:  Procedure Laterality Date   GANGLION CYST EXCISION  1964   left wrist   Family History  Problem Relation Age of Onset   Hypertension Mother    Heart disease Mother    Alcohol abuse Father    Social History   Occupational History   Not on file  Tobacco Use   Smoking status: Former    Current packs/day: 0.00    Average packs/day: 0.5 packs/day for 10.0 years (5.0 ttl pk-yrs)    Types: Cigarettes    Start date: 11/26/2001    Quit date: 11/27/2011    Years since quitting: 12.4   Smokeless tobacco: Never  Vaping Use   Vaping status: Never Used  Substance and Sexual Activity   Alcohol use: No    Alcohol/week: 0.0 standard drinks of alcohol   Drug use: No   Sexual activity: Not on file   Tobacco Counseling Counseling given: No  SDOH Screenings   Food Insecurity: No Food Insecurity (04/19/2024)  Housing: Unknown (04/19/2024)  Transportation Needs: No Transportation Needs (04/19/2024)  Utilities: Not At Risk (04/19/2024)  Alcohol Screen: Low Risk (02/22/2024)  Depression (PHQ2-9): Low Risk (04/19/2024)  Financial Resource Strain: Low Risk (02/22/2024)  Physical Activity: Sufficiently Active (04/19/2024)  Social Connections: Socially Integrated (04/19/2024)  Stress: No Stress Concern Present (04/19/2024)  Tobacco Use: Medium Risk (04/19/2024)  Health Literacy: Adequate Health Literacy (04/19/2024)   See flowsheets for full screening details  Depression Screen PHQ 2 & 9 Depression Scale- Over the past 2 weeks, how often have you been bothered by any of the following problems? Little interest or  pleasure in doing things: 0 Feeling down, depressed, or hopeless (PHQ Adolescent also includes...irritable): 0 PHQ-2 Total Score: 0     Goals Addressed               This Visit's Progress     Remain active (pt-stated)               Objective:    Today's Vitals   04/19/24 0840  BP: 120/60  Pulse: 82  Temp: 98.6 F (37 C)  TempSrc: Oral  SpO2: 97%  Weight: 186 lb 9.6 oz (84.6 kg)  Height: 6' 2 (1.88 m)   Body mass index is 23.96 kg/m.  Hearing/Vision screen Hearing Screening - Comments:: Denies hearing difficulties   Vision Screening - Comments:: Wears rx glasses - up to date with routine eye exams with  [facility] Immunizations and Health Maintenance Health Maintenance  Topic Date Due   FOOT EXAM  02/24/2023   COVID-19 Vaccine (10 - 2025-26 season) 07/14/2024   OPHTHALMOLOGY EXAM  07/17/2024   HEMOGLOBIN A1C  08/19/2024   Diabetic kidney evaluation - eGFR measurement  02/18/2025   Diabetic kidney evaluation - Urine ACR  02/18/2025   Medicare Annual Wellness (AWV)  04/19/2025   Colonoscopy  01/06/2026   DTaP/Tdap/Td (4 - Td or Tdap) 01/30/2031   Pneumococcal Vaccine: 50+ Years  Completed   Hepatitis C Screening  Completed   Zoster Vaccines- Shingrix  Completed   Meningococcal B Vaccine  Aged Out   Influenza Vaccine  Discontinued        Assessment/Plan:  This is a routine wellness examination for [place].  Patient Care Team: Micheal Wolm ORN, MD as PCP - General  I have personally reviewed and noted the following in the patients chart:   Medical and social history Use of alcohol, tobacco or illicit drugs  Current medications and supplements including opioid prescriptions. Functional ability and status Nutritional status Physical activity Advanced directives List of other physicians Hospitalizations, surgeries, and ER visits in previous 12 months Vitals Screenings to include cognitive, depression, and falls Referrals and  appointments  No orders of the defined types were placed in this encounter.  In addition, I have reviewed and discussed with patient certain preventive protocols, quality metrics, and best practice recommendations. A written personalized care plan for preventive services as well as general preventive health recommendations were provided to patient.   Rojelio ORN Blush, LPN   [id number]   Return in 1 year (on 04/28/2025).  After Visit Summary: (In Person-Declined) Patient declined AVS at this time.  Nurse Notes: No voiced or noted concerns at this time "

## 2024-05-14 ENCOUNTER — Encounter: Payer: Self-pay | Admitting: Family Medicine

## 2024-05-15 MED ORDER — PRAVASTATIN SODIUM 40 MG PO TABS: 40.0000 mg | ORAL_TABLET | Freq: Every day | ORAL | 3 refills | Status: AC

## 2024-05-22 ENCOUNTER — Other Ambulatory Visit

## 2024-05-23 ENCOUNTER — Ambulatory Visit: Payer: Self-pay | Admitting: Family Medicine

## 2024-05-23 ENCOUNTER — Other Ambulatory Visit (INDEPENDENT_AMBULATORY_CARE_PROVIDER_SITE_OTHER)

## 2024-05-23 DIAGNOSIS — E785 Hyperlipidemia, unspecified: Secondary | ICD-10-CM

## 2024-05-23 DIAGNOSIS — E1165 Type 2 diabetes mellitus with hyperglycemia: Secondary | ICD-10-CM | POA: Diagnosis not present

## 2024-05-23 LAB — COMPREHENSIVE METABOLIC PANEL WITH GFR
ALT: 16 U/L (ref 3–53)
AST: 16 U/L (ref 5–37)
Albumin: 4.5 g/dL (ref 3.5–5.2)
Alkaline Phosphatase: 48 U/L (ref 39–117)
BUN: 13 mg/dL (ref 6–23)
CO2: 30 meq/L (ref 19–32)
Calcium: 9.2 mg/dL (ref 8.4–10.5)
Chloride: 104 meq/L (ref 96–112)
Creatinine, Ser: 0.79 mg/dL (ref 0.40–1.50)
GFR: 89.21 mL/min
Glucose, Bld: 134 mg/dL — ABNORMAL HIGH (ref 70–99)
Potassium: 4.5 meq/L (ref 3.5–5.1)
Sodium: 139 meq/L (ref 135–145)
Total Bilirubin: 1 mg/dL (ref 0.2–1.2)
Total Protein: 7.5 g/dL (ref 6.0–8.3)

## 2024-05-23 LAB — LIPID PANEL
Cholesterol: 119 mg/dL (ref 28–200)
HDL: 51.7 mg/dL
LDL Cholesterol: 53 mg/dL (ref 10–99)
NonHDL: 66.94
Total CHOL/HDL Ratio: 2
Triglycerides: 69 mg/dL (ref 10.0–149.0)
VLDL: 13.8 mg/dL (ref 0.0–40.0)

## 2024-05-23 LAB — HEMOGLOBIN A1C: Hgb A1c MFr Bld: 5.8 % (ref 4.6–6.5)

## 2024-05-29 ENCOUNTER — Ambulatory Visit: Admitting: Family Medicine

## 2024-05-31 ENCOUNTER — Ambulatory Visit: Admitting: Family Medicine

## 2024-05-31 ENCOUNTER — Encounter: Payer: Self-pay | Admitting: Family Medicine

## 2024-05-31 VITALS — BP 132/62 | HR 82 | Wt 188.8 lb

## 2024-05-31 DIAGNOSIS — Z125 Encounter for screening for malignant neoplasm of prostate: Secondary | ICD-10-CM

## 2024-05-31 DIAGNOSIS — E785 Hyperlipidemia, unspecified: Secondary | ICD-10-CM

## 2024-05-31 DIAGNOSIS — E119 Type 2 diabetes mellitus without complications: Secondary | ICD-10-CM

## 2024-05-31 NOTE — Progress Notes (Signed)
 "  Established Patient Office Visit  Subjective   Patient ID: Luis Ross, male    DOB: [birth year]  Age: 74 y.o. MRN: [id number]  Chief Complaint  Patient presents with   Medical Management of Chronic Issues    HPI    Luis Ross is seen today for routine medical follow-up.  He has history of type 2 diabetes, dyslipidemia, rosacea.  He was having some orthostatic symptoms and concern that he lost too much weight on higher dose Mounjaro  and we reduced the dosage several months ago to the 7.5 mg once weekly.  Also reduced Invokana  to 100 mg daily.  Weight has stabilized and perhaps increased just slightly since then.  No further episodes of dizziness.  Generally feels well.  Current medications include Mounjaro  7.5 mg once weekly, pravastatin  40 mg daily, Invokana  100 mg daily.  He had been on losartan  without we discontinued secondary to orthostasis.  Recent labs reviewed.  A1c stable at 5.8%.  Blood chemistries all stable.  Lipids well-controlled with total cholesterol 119 and LDL 53.  Triglycerides were normal.  Eye exam up-to-date.  Needs diabetic foot exam today.  No neuropathy symptoms.  Past Medical History:  Diagnosis Date   HYPERLIPIDEMIA 02/26/2010   Past Surgical History:  Procedure Laterality Date   GANGLION CYST EXCISION  1964   left wrist    reports that he quit smoking about 12 years ago. His smoking use included cigarettes. He started smoking about 22 years ago. He has a 5 pack-year smoking history. He has never used smokeless tobacco. He reports that he does not drink alcohol and does not use drugs. family history includes Alcohol abuse in his father; Heart disease in his mother; Hypertension in his mother. Allergies[1]  Review of Systems  Constitutional:  Negative for malaise/fatigue.  Eyes:  Negative for blurred vision.  Respiratory:  Negative for shortness of breath.   Cardiovascular:  Negative for chest pain.  Gastrointestinal:  Negative for abdominal pain.   Neurological:  Negative for dizziness, weakness and headaches.      Objective:     BP 132/62   Pulse 82   Wt 188 lb 12.8 oz (85.6 kg)   SpO2 96%   BMI 24.24 kg/m  BP Readings from Last 3 Encounters:  05/31/24 132/62  04/19/24 120/60  02/26/24 122/70   Wt Readings from Last 3 Encounters:  05/31/24 188 lb 12.8 oz (85.6 kg)  04/19/24 186 lb 9.6 oz (84.6 kg)  02/26/24 182 lb 9.6 oz (82.8 kg)      Physical Exam Vitals reviewed.  Constitutional:      General: He is not in acute distress.    Appearance: He is not ill-appearing.  Cardiovascular:     Rate and Rhythm: Normal rate and regular rhythm.     Heart sounds: No murmur heard. Pulmonary:     Effort: Pulmonary effort is normal.     Breath sounds: Normal breath sounds. No wheezing or rales.  Musculoskeletal:     Right lower leg: No edema.     Left lower leg: No edema.  Skin:    Comments: Feet reveal no skin lesions. Good distal foot pulses. Good capillary refill. No calluses. Normal sensation with monofilament testing   Neurological:     Mental Status: He is alert.      No results found for any visits on 05/31/24.  Last CBC Lab Results  Component Value Date   WBC 5.7 09/30/2022   HGB 16.3 09/30/2022   HCT 48.3  09/30/2022   MCV 93.9 09/30/2022   RDW 13.1 09/30/2022   PLT 211.0 09/30/2022   Last metabolic panel Lab Results  Component Value Date   GLUCOSE 134 (H) 05/23/2024   NA 139 05/23/2024   K 4.5 05/23/2024   CL 104 05/23/2024   CO2 30 05/23/2024   BUN 13 05/23/2024   CREATININE 0.79 05/23/2024   GFR 89.21 05/23/2024   CALCIUM 9.2 05/23/2024   PROT 7.5 05/23/2024   ALBUMIN 4.5 05/23/2024   BILITOT 1.0 05/23/2024   ALKPHOS 48 05/23/2024   AST 16 05/23/2024   ALT 16 05/23/2024   Last lipids Lab Results  Component Value Date   CHOL 119 05/23/2024   HDL 51.70 05/23/2024   LDLCALC 53 05/23/2024   LDLDIRECT 122.0 06/03/2019   TRIG 69.0 05/23/2024   CHOLHDL 2 05/23/2024   Last  hemoglobin A1c Lab Results  Component Value Date   HGBA1C 5.8 05/23/2024      The ASCVD Risk score (Arnett DK, et al., 2019) failed to calculate for the following reasons:   The valid total cholesterol range is 130 to 320 mg/dL    Assessment & Plan:   #1 type 2 diabetes controlled with A1c 5.8%.  We recently reduced dose of Mounjaro  to 7.5 mg secondary to some orthostatic symptoms.  Symptoms have improved since then.  He is getting yearly diabetic eye exams.  Foot exam unremarkable today.  Reassess in about 4 months  #2 hyperlipidemia treated with pravastatin .  Recent LDL controlled at 53.  Continue low saturated fat diet.  Future lab order placed for repeat lipid and CMP in about 4 months  #3 prostate cancer screening.  No PSA in over 1 year.  Future lab order placed for PSA with next lab draw in 4 months Return in about 4 months (around 09/28/2024).    [name], MD     [1]  Allergies Allergen Reactions   Crestor [Rosuvastatin Calcium]     myalgias   Metformin  And Related Diarrhea   "

## 2024-10-23 ENCOUNTER — Other Ambulatory Visit

## 2024-10-30 ENCOUNTER — Ambulatory Visit: Admitting: Family Medicine

## 2025-04-28 ENCOUNTER — Ambulatory Visit
